# Patient Record
Sex: Female | Born: 1965 | Race: Black or African American | Hispanic: No | Marital: Married | Smoking: Never smoker
Health system: Southern US, Community
[De-identification: ages and names within clinical notes are randomized; demographics above are authoritative.]

## PROBLEM LIST (undated history)

## (undated) DIAGNOSIS — D126 Benign neoplasm of colon, unspecified: Secondary | ICD-10-CM

## (undated) DIAGNOSIS — R200 Anesthesia of skin: Secondary | ICD-10-CM

## (undated) DIAGNOSIS — I10 Essential (primary) hypertension: Secondary | ICD-10-CM

## (undated) DIAGNOSIS — D649 Anemia, unspecified: Secondary | ICD-10-CM

## (undated) DIAGNOSIS — N951 Menopausal and female climacteric states: Secondary | ICD-10-CM

## (undated) DIAGNOSIS — T7840XA Allergy, unspecified, initial encounter: Secondary | ICD-10-CM

## (undated) DIAGNOSIS — K5790 Diverticulosis of intestine, part unspecified, without perforation or abscess without bleeding: Secondary | ICD-10-CM

## (undated) DIAGNOSIS — M79606 Pain in leg, unspecified: Secondary | ICD-10-CM

## (undated) DIAGNOSIS — K219 Gastro-esophageal reflux disease without esophagitis: Secondary | ICD-10-CM

## (undated) HISTORY — PX: WISDOM TOOTH EXTRACTION: SHX21

## (undated) HISTORY — DX: Gastro-esophageal reflux disease without esophagitis: K21.9

## (undated) HISTORY — DX: Pain in leg, unspecified: M79.606

## (undated) HISTORY — DX: Essential (primary) hypertension: I10

## (undated) HISTORY — DX: Diverticulosis of intestine, part unspecified, without perforation or abscess without bleeding: K57.90

## (undated) HISTORY — DX: Menopausal and female climacteric states: N95.1

## (undated) HISTORY — PX: DILATION AND CURETTAGE OF UTERUS: SHX78

## (undated) HISTORY — DX: Benign neoplasm of colon, unspecified: D12.6

## (undated) HISTORY — DX: Allergy, unspecified, initial encounter: T78.40XA

## (undated) HISTORY — PX: BREAST EXCISIONAL BIOPSY: SUR124

## (undated) HISTORY — DX: Anesthesia of skin: R20.0

## (undated) HISTORY — DX: Anemia, unspecified: D64.9

## (undated) HISTORY — PX: TUBAL LIGATION: SHX77

---
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
## 2007-01-02 ENCOUNTER — Emergency Department (HOSPITAL_COMMUNITY): Admission: EM | Admit: 2007-01-02 | Discharge: 2007-01-02 | Payer: Self-pay | Admitting: Emergency Medicine

## 2009-11-17 ENCOUNTER — Emergency Department (HOSPITAL_COMMUNITY): Admission: EM | Admit: 2009-11-17 | Discharge: 2009-11-17 | Payer: Self-pay | Admitting: Emergency Medicine

## 2010-02-26 ENCOUNTER — Ambulatory Visit (HOSPITAL_COMMUNITY): Admission: RE | Admit: 2010-02-26 | Discharge: 2010-02-26 | Payer: Self-pay | Admitting: Obstetrics and Gynecology

## 2011-01-12 LAB — CBC
Hemoglobin: 13 g/dL (ref 12.0–15.0)
RBC: 3.69 MIL/uL — ABNORMAL LOW (ref 3.87–5.11)
RDW: 13.3 % (ref 11.5–15.5)
WBC: 6.1 10*3/uL (ref 4.0–10.5)

## 2011-01-12 LAB — COMPREHENSIVE METABOLIC PANEL
ALT: 28 U/L (ref 0–35)
Alkaline Phosphatase: 38 U/L — ABNORMAL LOW (ref 39–117)
CO2: 28 mEq/L (ref 19–32)
Chloride: 103 mEq/L (ref 96–112)
Glucose, Bld: 79 mg/dL (ref 70–99)
Potassium: 3.7 mEq/L (ref 3.5–5.1)
Sodium: 137 mEq/L (ref 135–145)
Total Bilirubin: 0.5 mg/dL (ref 0.3–1.2)
Total Protein: 8.1 g/dL (ref 6.0–8.3)

## 2011-01-12 LAB — HCG, SERUM, QUALITATIVE: Preg, Serum: NEGATIVE

## 2011-02-16 ENCOUNTER — Other Ambulatory Visit: Payer: Self-pay | Admitting: Obstetrics and Gynecology

## 2011-02-16 DIAGNOSIS — R928 Other abnormal and inconclusive findings on diagnostic imaging of breast: Secondary | ICD-10-CM

## 2011-02-17 ENCOUNTER — Ambulatory Visit
Admission: RE | Admit: 2011-02-17 | Discharge: 2011-02-17 | Disposition: A | Discharge status: Home / Self Care | Payer: 59 | Source: Ambulatory Visit | Attending: Obstetrics and Gynecology | Admitting: Obstetrics and Gynecology

## 2011-02-17 DIAGNOSIS — R928 Other abnormal and inconclusive findings on diagnostic imaging of breast: Secondary | ICD-10-CM

## 2013-11-05 ENCOUNTER — Other Ambulatory Visit: Payer: Self-pay | Admitting: Obstetrics and Gynecology

## 2013-11-05 DIAGNOSIS — R928 Other abnormal and inconclusive findings on diagnostic imaging of breast: Secondary | ICD-10-CM

## 2013-11-19 ENCOUNTER — Ambulatory Visit
Admission: RE | Admit: 2013-11-19 | Discharge: 2013-11-19 | Disposition: A | Discharge status: Home / Self Care | Payer: 59 | Source: Ambulatory Visit | Attending: Obstetrics and Gynecology | Admitting: Obstetrics and Gynecology

## 2013-11-19 DIAGNOSIS — R928 Other abnormal and inconclusive findings on diagnostic imaging of breast: Secondary | ICD-10-CM

## 2014-06-18 ENCOUNTER — Encounter: Payer: Self-pay | Admitting: *Deleted

## 2014-06-19 ENCOUNTER — Ambulatory Visit (INDEPENDENT_AMBULATORY_CARE_PROVIDER_SITE_OTHER): Payer: 59 | Admitting: Neurology

## 2014-06-19 ENCOUNTER — Encounter: Payer: Self-pay | Admitting: Neurology

## 2014-06-19 ENCOUNTER — Telehealth: Payer: Self-pay | Admitting: *Deleted

## 2014-06-19 VITALS — BP 120/85 | HR 91 | Ht 64.0 in | Wt 159.8 lb

## 2014-06-19 DIAGNOSIS — H539 Unspecified visual disturbance: Secondary | ICD-10-CM | POA: Insufficient documentation

## 2014-06-19 DIAGNOSIS — R209 Unspecified disturbances of skin sensation: Secondary | ICD-10-CM

## 2014-06-19 DIAGNOSIS — M5010 Cervical disc disorder with radiculopathy, unspecified cervical region: Secondary | ICD-10-CM

## 2014-06-19 DIAGNOSIS — M5412 Radiculopathy, cervical region: Secondary | ICD-10-CM

## 2014-06-19 DIAGNOSIS — M79662 Pain in left lower leg: Secondary | ICD-10-CM | POA: Insufficient documentation

## 2014-06-19 DIAGNOSIS — G609 Hereditary and idiopathic neuropathy, unspecified: Secondary | ICD-10-CM

## 2014-06-19 DIAGNOSIS — R29818 Other symptoms and signs involving the nervous system: Secondary | ICD-10-CM

## 2014-06-19 DIAGNOSIS — M79609 Pain in unspecified limb: Secondary | ICD-10-CM

## 2014-06-19 DIAGNOSIS — R202 Paresthesia of skin: Secondary | ICD-10-CM

## 2014-06-19 NOTE — Progress Notes (Addendum)
[facility]    Provider:  Dr Jaynee Eagles Referring Provider: Pedro Earls, MD Primary Care Physician:  No primary provider on file.  CC:  paresthesias  HPI:  Anita Brandt is a 48 y.o. female here as a referral from Dr. Delilah Shan for paresthesias  48 year old female PMHx HTN who is here for evaluation of left-sided paresthesias. She gets cramps in the left calf, it is hard to bend leg because of painful tightness. She is also getting shooting pains from her hip all th way down to calf. She also has radiating pain radiating in her upper arm, tingly and numb. Her vision changed drastically in march all of a sudden, had to get "readers" but with correction the vision not entirely back to baseline. Recently saw the optometrist and vision is still blurry. The paresthesias started in may of this year. The symptoms are getting worse. Her leg hurts. She is concerned she may have a blood clot. She can't walk sometimes it can get 5/10 and she is limping. These symptoms are different than her knee pain. No low back pain or neck pain. Sensations happen daily throughout the day. Can last a varying amount of time. Symptoms worse with increased activity but not better with rest; even when sleeping the symptoms persist. No weakness. No paresthesias in the toes or fingers.  Never had imaging of brain or neck.  No paresthesias in the fingers or toes. No FHx of neuromuscular or neurodegenerative disorders except grandmother with alzheimers.   Reviewed notes, labs and imaging from outside physicians, which showed patient has been followed by orthopedics for knee pain and has been evaluated regarding the paresthesias which generated today's referral to neurology. She was given a prednisone taper to see if that would help knee pain and in turn improve the sensory and muscle symptoms. Clinical examination of cervical, thoracic and lumbar spine was unremarkable.   Review of Systems: Patient complains of  symptoms per HPI as well as the following symptoms blurred vision, feeling hot, snoring, numbness, weakness, restless legs. Pertinent negatives per HPI. Otherwise out of a complete 14 system review, and all other reviewed systems are negative.   History   Social History  . Marital Status: Married    Spouse Name: N/A    Number of Children: 1  . Years of Education: BS   Occupational History  .      full-time   Social History Main Topics  . Smoking status: Never Smoker   . Smokeless tobacco: Never Used  . Alcohol Use: Yes     Comment: less than 5 times per week ( beer, wine and hard liquor)  . Drug Use: No  . Sexual Activity: Not on file   Other Topics Concern  . Not on file   Social History Narrative   Patient is married with one child.   Patient is right handed.   Patient has a BS degree.   Patient drinks 1 8 oz drink daily.    Family History  Problem Relation Age of Onset  . Cancer Father   . Cancer Maternal Grandmother   . Hypertension Mother   . Hypertension Father     Past Medical History  Diagnosis Date  . High blood pressure   . Numbness   . Leg pain   . Perimenopause     Past Surgical History  Procedure Laterality Date  . Dilation and curettage of uterus      Uterine abalation  . Tubal  ligation      Current Outpatient Prescriptions  Medication Sig Dispense Refill  . estradiol-norethindrone (COMBIPATCH) 0.05-0.25 MG/DAY Place 1 patch onto the skin 2 (two) times a week.      Marland Kitchen ibuprofen (ADVIL,MOTRIN) 800 MG tablet Take 800 mg by mouth 3 (three) times daily.      . Olmesartan Medoxomil-HCTZ (BENICAR HCT PO) Take by mouth.       No current facility-administered medications for this visit.    Allergies as of 06/19/2014 - Review Complete 06/19/2014  Allergen Reaction Noted  . Penicillin g potassium in d5w  06/18/2014  . Sulfa antibiotics  06/18/2014    Vitals: BP 120/85  Pulse 91  Ht 5\' 4"  (1.626 m)  Wt 159 lb 12.8 oz (72.485 kg)  BMI 27.42  kg/m2 Last Weight:  Wt Readings from Last 1 Encounters:  06/19/14 159 lb 12.8 oz (72.485 kg)   Last Height:   Ht Readings from Last 1 Encounters:  06/19/14 5\' 4"  (1.626 m)     Physical exam: Exam: Gen: NAD, conversant Eyes: anicteric sclerae, moist conjunctivae HENT: Atraumatic, oropharynx clear Neck: Trachea midline; supple,  Lungs: CTA, no wheezing, rales, rhonic                          CV: RRR, no MRG Abdomen: Soft, non-tender;  Extremities: No peripheral edema  Skin: Normal temperature, no rash,  Psych: Appropriate affect, pleasant  Neuro: Detailed Neurologic Exam  Speech:    Speech is normal; fluent and spontaneous with normal comprehension.  Cognition:    The patient is oriented to person, place, and time; memory intact; language fluent; normal attention, concentration, and fund of knowledge.  Cranial Nerves:    The pupils are equal, round, and reactive to light. The fundi are normal and spontaneous venous pulsations are present. Visual fields are full to finger confrontation. Extraocular movements are intact. Trigeminal sensation is intact and the muscles of mastication are normal. The face is symmetric. The palate elevates in the midline. Voice is normal. Shoulder shrug is normal. The tongue has normal motion without fasciculations.   Coordination:    Normal finger to nose and heel to shin. Normal rapid alternating movements.   Gait:    Heel-toe and tandem gait are normal.   Motor Observation:    No asymmetry, no atrophy, and no involuntary movements noted. Tone:    Normal muscle tone.   Posture:    Posture is normal.     Strength:    Strength is V/V in the upper and lower limbs.         Vibratory Sensation:    Normal vibratory sensation in upper and lower extremities.    Light Touch:    Normal light touch sensation in upper and lower extremities.    Proprioception:    Mildly impaired distally in the lower extremities   Pin Prick:    Normal  sensation to pinprick in upper and lower extremities.   Temperature: impaired distally in the lower extremities   Reflex Exam:  DTR's:    Deep tendon reflexes in the upper and lower extremities are normal bilaterally.   Toes:    The toes are downgoing bilaterally.  Clonus:    Clonus is absent.   Assessment:  48 year old female with a PMHx of high blood pressure who is here for evaluation of new onset, worsening paresthesias in the left arm and left leg with recent significant acute vision changes. Neuro  exam is significant for minimally decreased temperature and proprioception distally in the lower extremities.    Given paresthesias and vision changes will order MRI of the brain and cervical spine to rule out demyelinating disease such as MS or NMO and rule out other intracranial pathology.  Will further investigate paresthesias with an EMG/NCS of the left arm and leg focusing on peripheral polyneuropathy and lower leg sciatic/lumbosacral neuropathy. Also order serum neuropathy workup. Will send patient stat to rule out DVT in the left calf given persistent new pain and patient's report of calf swelling.  Cervical mri due to paresthesias in the upper extremity.   Sarina Ill, MD  [facility] [address] [place],  [address]  Phone [phone] Fax [phone] Lenor Coffin

## 2014-06-19 NOTE — Telephone Encounter (Signed)
Spoke with patient informed her that ultrasound was normal, no concerns for DVT, patient verbalized understanding.

## 2014-06-19 NOTE — Patient Instructions (Signed)
Overall you are doing fairly well but I do want to suggest a few things today:   Remember to drink plenty of fluid, eat healthy meals and do not skip any meals. Try to eat protein with a every meal and eat a healthy snack such as fruit or nuts in between meals. Try to keep a regular sleep-wake schedule and try to exercise daily, particularly in the form of walking, 20-30 minutes a day, if you can.   As far as diagnostic testing: emg/ncs, MRI of the brain and neck, blood work, doppler of the left leg  I would like to see you back in within 2 weeks for emg/ncs, sooner if we need to. Please call us with any interim questions, concerns, problems, updates or refill requests.   Please also call us for any test results so we can go over those with you on the phone.  My clinical assistant and will answer any of your questions and relay your messages to me and also relay most of my messages to you.   Our phone number is [phone]. We also have an after hours call service for urgent matters and there is a physician on-call for urgent questions. For any emergencies you know to call 911 or go to the nearest emergency room

## 2014-06-20 ENCOUNTER — Telehealth: Payer: Self-pay | Admitting: Neurology

## 2014-06-20 NOTE — Telephone Encounter (Signed)
Called patient to make sure she scheduled her emg/ncs. Also reviewed the [place] of her leg. No DVTs (office called her already with neg results).

## 2014-06-24 LAB — COMPREHENSIVE METABOLIC PANEL
ALT: 23 IU/L (ref 0–32)
AST: 20 IU/L (ref 0–40)
Albumin/Globulin Ratio: 1.6 (ref 1.1–2.5)
Albumin: 4.9 g/dL (ref 3.5–5.5)
Alkaline Phosphatase: 62 IU/L (ref 39–117)
BUN/Creatinine Ratio: 22 (ref 9–23)
BUN: 15 mg/dL (ref 6–24)
CALCIUM: 10.2 mg/dL (ref 8.7–10.2)
CHLORIDE: 97 mmol/L (ref 97–108)
CO2: 28 mmol/L (ref 18–29)
Creatinine, Ser: 0.67 mg/dL (ref 0.57–1.00)
GFR calc Af Amer: 121 mL/min/{1.73_m2} (ref >59)
GFR, EST NON AFRICAN AMERICAN: 105 mL/min/{1.73_m2} (ref >59)
GLUCOSE: 91 mg/dL (ref 65–99)
Globulin, Total: 3.1 g/dL (ref 1.5–4.5)
POTASSIUM: 4 mmol/L (ref 3.5–5.2)
SODIUM: 140 mmol/L (ref 134–144)
TOTAL PROTEIN: 8 g/dL (ref 6.0–8.5)
Total Bilirubin: 0.6 mg/dL (ref 0.0–1.2)

## 2014-06-24 LAB — IFE AND PE, SERUM
ALPHA 1: 0.2 g/dL (ref 0.1–0.4)
ALPHA2 GLOB SERPL ELPH-MCNC: 0.6 g/dL (ref 0.4–1.2)
Albumin SerPl Elph-Mcnc: 4.3 g/dL (ref 3.2–5.6)
Albumin/Glob SerPl: 1.2 (ref 0.7–2.0)
B-GLOBULIN SERPL ELPH-MCNC: 1.2 g/dL (ref 0.6–1.3)
GLOBULIN, TOTAL: 3.7 g/dL (ref 2.0–4.5)
Gamma Glob SerPl Elph-Mcnc: 1.6 g/dL (ref 0.5–1.6)
IGA/IMMUNOGLOBULIN A, SERUM: 228 mg/dL (ref 91–414)
IGM (IMMUNOGLOBULIN M), SRM: 43 mg/dL (ref 40–230)
IgG (Immunoglobin G), Serum: 1690 mg/dL — ABNORMAL HIGH (ref 700–1600)

## 2014-06-24 LAB — HIV ANTIBODY (ROUTINE TESTING W REFLEX)
HIV 1/O/2 Abs-Index Value: <1 (ref <1.00)
HIV-1/HIV-2 Ab: NONREACTIVE

## 2014-06-24 LAB — CRYOGLOBULIN, QL, SERUM, RFLX

## 2014-06-24 LAB — ANA W/REFLEX: Anti Nuclear Antibody(ANA): NEGATIVE

## 2014-06-24 LAB — HEMOGLOBIN A1C
ESTIMATED AVERAGE GLUCOSE: 105 mg/dL
HEMOGLOBIN A1C: 5.3 % (ref 4.8–5.6)

## 2014-06-24 LAB — VITAMIN B12: VITAMIN B 12: 1808 pg/mL — AB (ref 211–946)

## 2014-06-24 LAB — ANGIOTENSIN CONVERTING ENZYME: ANGIO CONVERT ENZYME: 43 U/L (ref 14–82)

## 2014-06-24 LAB — ANTIMYELOPEROXIDASE (MPO) ABS

## 2014-06-24 LAB — LYME, TOTAL AB TEST/REFLEX

## 2014-06-24 LAB — RHEUMATOID FACTOR: RHEUMATOID FACTOR: 7.7 [IU]/mL (ref 0.0–13.9)

## 2014-06-24 LAB — SEDIMENTATION RATE: SED RATE: 10 mm/h (ref 0–32)

## 2014-06-24 LAB — TSH: TSH: 0.658 u[IU]/mL (ref 0.450–4.500)

## 2014-06-25 ENCOUNTER — Ambulatory Visit (INDEPENDENT_AMBULATORY_CARE_PROVIDER_SITE_OTHER): Payer: 59 | Admitting: Neurology

## 2014-06-25 ENCOUNTER — Encounter (INDEPENDENT_AMBULATORY_CARE_PROVIDER_SITE_OTHER): Payer: Self-pay | Admitting: Radiology

## 2014-06-25 DIAGNOSIS — R209 Unspecified disturbances of skin sensation: Secondary | ICD-10-CM

## 2014-06-25 DIAGNOSIS — G609 Hereditary and idiopathic neuropathy, unspecified: Secondary | ICD-10-CM

## 2014-06-25 DIAGNOSIS — M5416 Radiculopathy, lumbar region: Secondary | ICD-10-CM

## 2014-06-25 DIAGNOSIS — IMO0002 Reserved for concepts with insufficient information to code with codable children: Secondary | ICD-10-CM

## 2014-06-25 DIAGNOSIS — Z0289 Encounter for other administrative examinations: Secondary | ICD-10-CM

## 2014-06-25 DIAGNOSIS — R202 Paresthesia of skin: Secondary | ICD-10-CM

## 2014-06-25 NOTE — Progress Notes (Addendum)
   [facility]    Provider:  Dr Jaynee Eagles Referring Provider: No ref. provider found Primary Care Physician:  No primary provider on file.  CC:  paresthesias  HPI:  Anita Brandt is a 48 y.o. female here as a referral from Dr. No ref. provider found for paresthesias   History: 48 year old female PMHx HTN who is here for evaluation of left-sided paresthesias. She gets cramps in the left calf, it is hard to bend leg because of painful tightness. She is also getting shooting pains from her hip all the way down to calf. She also has radiating pain into her upper arm, tingly and numb.  The paresthesias started in may of this year. The symptoms are getting worse. Her leg hurts. She can't walk sometimes it can get 5/10 and she is limping. These symptoms are different than her knee pain. No low back pain or neck pain. Sensations happen throughout the day every day. Can last a varying amount of time. Symptoms worse with increased activity but not better with rest; even when sleeping the symptoms persist. No weakness. No paresthesias in the toes or fingers.Neuro exam is significant for minimally decreased temperature and proprioception distally in the lower extremities.    Summary: Nerve Conduction Studies were performed on the left upper and left lower extremities. Evaluation of the the left Median, left Ulnar, left Peroneal and left Tibial motor nerves showed normal conductions and normal F wave latencies.  Evaluation of the left Median, left Ulnar, left Sural sensory nerves revealed normal conductions. The Median vs. Ulnar Palmar Mixed Nerve Study showed no significant difference in latency.  Needle EMG study was performed on the left upper and left lower extremities. Evaluation of the left lumbar L5 paraspinal muscles showed increased spontaneous activity. The remaining left upper and left lower muscles were normal: Deltoid, Triceps, Pronator teres, First Dorsal Interosseous, Opponens  Pollicis, Anterior Tibialis, Extensor Hallucis Longus, Vastus Medialis, Medial Gastrocnemius, Biceps Femoris (long head), Gluteus Maximus and S1 paraspinal muscles.   Assessment/Plan:  EMG shows acute/ongoing denervation isolated to the L5 paraspinals, consistent with left radiculopathy. Given the lack of similar changes in limb muscles and considerable overlap in paraspinal innervation, a more precise localization cannot be identified at this time. No evidence for cervical radiculopathy, polyneuropathy or median/ulnar compression neuropathy. Clinical correlation recommended.  Sarina Ill, MD  [facility] [address] [place], [place] [address]  Phone [phone] Fax [phone] Lenor Coffin

## 2014-06-27 ENCOUNTER — Ambulatory Visit (INDEPENDENT_AMBULATORY_CARE_PROVIDER_SITE_OTHER): Payer: 59

## 2014-06-27 DIAGNOSIS — IMO0002 Reserved for concepts with insufficient information to code with codable children: Secondary | ICD-10-CM

## 2014-06-27 DIAGNOSIS — M5416 Radiculopathy, lumbar region: Secondary | ICD-10-CM

## 2014-06-27 DIAGNOSIS — R202 Paresthesia of skin: Secondary | ICD-10-CM

## 2014-06-27 DIAGNOSIS — R209 Unspecified disturbances of skin sensation: Secondary | ICD-10-CM

## 2014-06-27 DIAGNOSIS — R29818 Other symptoms and signs involving the nervous system: Secondary | ICD-10-CM

## 2014-06-27 DIAGNOSIS — G609 Hereditary and idiopathic neuropathy, unspecified: Secondary | ICD-10-CM

## 2014-06-27 MED ORDER — GADOPENTETATE DIMEGLUMINE 469.01 MG/ML IV SOLN
15.0000 mL | Freq: Once | INTRAVENOUS | Status: AC | PRN
Start: 2014-06-27 — End: 2014-06-27

## 2014-07-03 ENCOUNTER — Other Ambulatory Visit: Payer: Self-pay | Admitting: Neurology

## 2014-07-03 DIAGNOSIS — M5010 Cervical disc disorder with radiculopathy, unspecified cervical region: Secondary | ICD-10-CM

## 2014-07-03 DIAGNOSIS — M5416 Radiculopathy, lumbar region: Secondary | ICD-10-CM

## 2014-07-08 NOTE — Progress Notes (Signed)
Patient returned my call, stated that she has not yet heard from physical therapy, instructed her that she has not received a call by the end of this week to call back, patient verbalized understanding.

## 2014-07-08 NOTE — Progress Notes (Signed)
Referrals were completed on 07/05/14, called patient to see if anyone has contacted her for scheduling, left voice message for patient to return my call.

## 2014-07-16 ENCOUNTER — Other Ambulatory Visit: Payer: Self-pay | Admitting: Neurology

## 2014-07-16 ENCOUNTER — Telehealth: Payer: Self-pay | Admitting: *Deleted

## 2014-07-16 DIAGNOSIS — M5416 Radiculopathy, lumbar region: Secondary | ICD-10-CM

## 2014-07-16 DIAGNOSIS — M5412 Radiculopathy, cervical region: Secondary | ICD-10-CM

## 2014-07-16 MED ORDER — GABAPENTIN 300 MG PO CAPS: 900.0000 mg | ORAL_CAPSULE | Freq: Every day | ORAL | Status: DC

## 2014-07-16 NOTE — Telephone Encounter (Signed)
Pt calling and is asking about referral for epidural steroid injections.  [facility] do not do injections.   I called and spoke to [facility] with [place].  They did not receive order.  She printed off as we spoke.  She will call pt.   FYI (she relayed that the pt can only have a total of 3 injections in a 6 month time frame).  PT contacted pt and she will see them 07-29-14.  Pt is asking about oral medication to help with pain / sleep to get her by until injections.  Dr. Delilah Shan trid 800mg  ibuprofen which did help.  She asked about Lorrin Mais (to help her sleep) mask pain?  Her # [phone].  [facility] in [place].   Allergies:  PCN, sulfa.

## 2014-07-16 NOTE — Telephone Encounter (Signed)
Thank you Lovey Newcomer!! Can you please insure that patient gets an appointment. I will prescribe her neurontin at night. I spoke to her about all of this. Thank you!!

## 2014-07-17 NOTE — Telephone Encounter (Signed)
Pt scheduled for 07-30-14 at 1430 at [facility] for epidural steroid injections.

## 2014-07-26 ENCOUNTER — Ambulatory Visit: Payer: 59

## 2014-07-29 ENCOUNTER — Ambulatory Visit: Payer: 59 | Attending: Neurology

## 2014-07-29 DIAGNOSIS — M5412 Radiculopathy, cervical region: Secondary | ICD-10-CM | POA: Diagnosis not present

## 2014-07-29 DIAGNOSIS — R52 Pain, unspecified: Secondary | ICD-10-CM | POA: Diagnosis not present

## 2014-07-29 DIAGNOSIS — Z5189 Encounter for other specified aftercare: Secondary | ICD-10-CM | POA: Diagnosis present

## 2014-07-30 ENCOUNTER — Ambulatory Visit
Admission: RE | Admit: 2014-07-30 | Discharge: 2014-07-30 | Disposition: A | Discharge status: Home / Self Care | Payer: 59 | Source: Ambulatory Visit | Attending: Neurology | Admitting: Neurology

## 2014-07-30 VITALS — BP 113/78 | HR 62

## 2014-07-30 DIAGNOSIS — M5412 Radiculopathy, cervical region: Secondary | ICD-10-CM

## 2014-07-30 DIAGNOSIS — M5416 Radiculopathy, lumbar region: Secondary | ICD-10-CM

## 2014-07-30 MED ORDER — METHYLPREDNISOLONE ACETATE 40 MG/ML INJ SUSP (RADIOLOG
120.0000 mg | Freq: Once | INTRAMUSCULAR | Status: AC
  Administered 2014-07-30: 120 mg via EPIDURAL

## 2014-07-30 MED ORDER — IOHEXOL 180 MG/ML  SOLN
1.0000 mL | Freq: Once | INTRAMUSCULAR | Status: AC | PRN
  Administered 2014-07-30: 1 mL via EPIDURAL

## 2014-07-30 NOTE — Discharge Instructions (Signed)

## 2014-07-31 ENCOUNTER — Other Ambulatory Visit: Payer: Self-pay | Admitting: Neurology

## 2014-07-31 DIAGNOSIS — M542 Cervicalgia: Secondary | ICD-10-CM

## 2014-08-05 ENCOUNTER — Ambulatory Visit: Payer: 59 | Admitting: Physical Therapy

## 2014-08-05 DIAGNOSIS — Z5189 Encounter for other specified aftercare: Secondary | ICD-10-CM | POA: Diagnosis not present

## 2014-08-08 ENCOUNTER — Ambulatory Visit: Payer: 59 | Admitting: Physical Therapy

## 2014-08-08 DIAGNOSIS — Z5189 Encounter for other specified aftercare: Secondary | ICD-10-CM | POA: Diagnosis not present

## 2014-08-12 ENCOUNTER — Ambulatory Visit: Payer: 59 | Admitting: Physical Therapy

## 2014-08-12 DIAGNOSIS — Z5189 Encounter for other specified aftercare: Secondary | ICD-10-CM | POA: Diagnosis not present

## 2014-08-21 ENCOUNTER — Ambulatory Visit
Admission: RE | Admit: 2014-08-21 | Discharge: 2014-08-21 | Disposition: A | Discharge status: Home / Self Care | Payer: 59 | Source: Ambulatory Visit | Attending: Neurology | Admitting: Neurology

## 2014-08-21 VITALS — BP 118/74 | HR 81

## 2014-08-21 DIAGNOSIS — M542 Cervicalgia: Secondary | ICD-10-CM

## 2014-08-21 MED ORDER — METHYLPREDNISOLONE ACETATE 40 MG/ML INJ SUSP (RADIOLOG
120.0000 mg | Freq: Once | INTRAMUSCULAR | Status: AC
  Administered 2014-08-21: 120 mg via EPIDURAL

## 2014-08-21 MED ORDER — IOHEXOL 180 MG/ML  SOLN
1.0000 mL | Freq: Once | INTRAMUSCULAR | Status: AC | PRN
  Administered 2014-08-21: 1 mL via EPIDURAL

## 2014-08-22 ENCOUNTER — Ambulatory Visit: Payer: 59

## 2014-09-04 ENCOUNTER — Ambulatory Visit: Payer: 59 | Attending: Neurology

## 2014-09-04 DIAGNOSIS — Z5189 Encounter for other specified aftercare: Secondary | ICD-10-CM | POA: Diagnosis present

## 2014-09-04 DIAGNOSIS — M5010 Cervical disc disorder with radiculopathy, unspecified cervical region: Secondary | ICD-10-CM

## 2014-09-04 DIAGNOSIS — R52 Pain, unspecified: Secondary | ICD-10-CM | POA: Insufficient documentation

## 2014-09-04 DIAGNOSIS — M5412 Radiculopathy, cervical region: Secondary | ICD-10-CM | POA: Insufficient documentation

## 2014-09-04 DIAGNOSIS — M25562 Pain in left knee: Secondary | ICD-10-CM

## 2014-09-04 NOTE — Patient Instructions (Signed)
Pt reported she is fine and is not doing HEP. She demonstrated poor body mechanics with picking items out of purse. We reviewed good posture(she is aware as she uses a towel roll in chair at work) and good Economist with explanation about limiting bending and twisting and how this decreases load to spine. We discussed continuing HEP but she expressed that she is not doing them.

## 2014-09-04 NOTE — Therapy (Signed)
Physical Therapy Treatment  Patient Details  Name: Anita Brandt MRN: [id number] Date of Birth: 02-Feb-1966  Encounter Date: 09/04/2014      PT End of Session - 09/04/14 0923    Visit Number 5   Number of Visits 12   PT Start Time 0850   PT Stop Time 0920   PT Time Calculation (min) 30 min   Activity Tolerance Patient tolerated treatment well   Behavior During Therapy [facility] for tasks assessed/performed      Past Medical History  Diagnosis Date  . High blood pressure   . Numbness   . Leg pain   . Perimenopause     Past Surgical History  Procedure Laterality Date  . Dilation and curettage of uterus      Uterine abalation  . Tubal ligation      There were no vitals taken for this visit.  Visit Diagnosis:  Cervical disc disorder with radiculopathy of cervical region  Knee pain, acute, left      Subjective Assessment - 09/04/14 0854    Symptoms She recieved an injection into lower back 08/21/14.    Today she has a little weakness in LT knee.  She feels the injection may wear off.  She is independent now without problems.   How long can you sit comfortably? as needed   How long can you stand comfortably? Stand as needed   How long can you walk comfortably? walk as needed   Currently in Pain? No/denies              PT Education - 09/04/14 0922    Education provided Yes   Education Details posture and body mechanics   Person(s) Educated Patient   Methods Explanation;Demonstration   Comprehension Verbalized understanding;Returned demonstration          PT Short Term Goals - 09/04/14 0924    PT SHORT TERM GOAL #1   Title Independent with inital HEP    Status Achieved   PT SHORT TERM GOAL #2   Title Pain decreased 25% or mroe in knee   Status Achieved          PT Long Term Goals - 09/04/14 0925    PT LONG TERM GOAL #1   Title Demonstrate understanding of use of cold to decr edema /pain   Status Deferred   PT LONG TERM GOAL #2   Title  Independent with advanced HEP   Status Not Met  she is not doing her HEP   PT LONG TERM GOAL #3   Title Report 75% decreased pain in LT knee with walking   Status Achieved   PT LONG TERM GOAL #4   Title Increase active LT knee flexion to 125 degrees to allow smooth walking   Status Achieved   PT LONG TERM GOAL #5   Title Incr LLT knee extensiont to 180 degrees to allow ease with walking and ease pain with stretching   Status Achieved          Plan - 09/04/14 0923    Clinical Impression Statement Ms Ezekiel is doing well post injection and has no limitations.  she agreed to discharge.   Consulted and Agree with Plan of Care Patient        Problem List Patient Active Problem List   Diagnosis Date Noted  . Paresthesias 06/19/2014  . Pain of left calf 06/19/2014  . Vision changes 06/19/2014     PHYSICAL THERAPY DISCHARGE SUMMARY  Visits from [facility]  of Care: 5  Current functional level related to goals / functional outcomes: See ABOVE   Remaining deficits: None   Education / Equipment: HEP and Water engineer education Plan: Patient agrees to discharge.  Patient goals were not met. Patient is being discharged due to being pleased with the current functional level.  She is independent without pain. ?????                                              Darrel Hoover 09/04/2014, 9:30 AM

## 2014-09-20 ENCOUNTER — Emergency Department (HOSPITAL_COMMUNITY)
Admission: EM | Admit: 2014-09-20 | Discharge: 2014-09-20 | Disposition: A | Discharge status: Home / Self Care | Payer: 59 | Source: Home / Self Care | Attending: Family Medicine | Admitting: Family Medicine

## 2014-09-20 ENCOUNTER — Emergency Department (INDEPENDENT_AMBULATORY_CARE_PROVIDER_SITE_OTHER): Payer: 59

## 2014-09-20 ENCOUNTER — Encounter (HOSPITAL_COMMUNITY): Payer: Self-pay | Admitting: *Deleted

## 2014-09-20 DIAGNOSIS — S60221A Contusion of right hand, initial encounter: Secondary | ICD-10-CM

## 2014-09-20 NOTE — ED Notes (Signed)
Med  r  Thumb  Spica  splint

## 2014-09-20 NOTE — ED Notes (Signed)
Pt  Fell  Last  Pm  On  The  Floor    And   Injured  Her  r  Hand    -  She  Has  Bruising  /  Swelling  Noted       She  Reports  This  Happened  Yesterday          She   Did  Not  Black out  She  Is  Sitting  Upright on  Exam table  Speaking    In  Complete      sentances

## 2014-09-20 NOTE — Discharge Instructions (Signed)
Ice and splint as needed, then warm soak twice daily as needed, see orthopedist in 2 weeks if further problems.

## 2014-09-20 NOTE — ED Provider Notes (Signed)
CSN: [id number]     Arrival date & time 09/20/14  1419 History   First MD Initiated Contact with Patient 09/20/14 1509     Chief Complaint  Patient presents with  . Hand Injury   (Consider location/radiation/quality/duration/timing/severity/associated sxs/prior Treatment) Patient is a 48 y.o. female presenting with hand injury. The history is provided by the patient.  Hand Injury Location:  Hand Time since incident:  1 day Injury: yes   Mechanism of injury: fall   Fall:    Fall occurred: on hardwood floor in kitchen last eve.   Entrapped after fall: no   Hand location:  R hand Pain details:    Quality:  Aching   Radiates to:  Does not radiate   Severity:  Mild   Onset quality:  Gradual Chronicity:  New Handedness:  Right-handed Dislocation: no   Foreign body present:  No foreign bodies Prior injury to area:  No   Past Medical History  Diagnosis Date  . High blood pressure   . Numbness   . Leg pain   . Perimenopause    Past Surgical History  Procedure Laterality Date  . Dilation and curettage of uterus      Uterine abalation  . Tubal ligation     Family History  Problem Relation Age of Onset  . Cancer Father   . Cancer Maternal Grandmother   . Hypertension Mother   . Hypertension Father    History  Substance Use Topics  . Smoking status: Never Smoker   . Smokeless tobacco: Never Used  . Alcohol Use: 3.0 oz/week    6 drink(s) per week     Comment: less than 5 times per week ( beer, wine and hard liquor)   OB History    No data available     Review of Systems  Constitutional: Negative.   Musculoskeletal: Positive for myalgias. Negative for gait problem.  Skin: Negative for wound.    Allergies  Penicillin g potassium in d5w and Sulfa antibiotics  Home Medications   Prior to Admission medications   Medication Sig Start Date End Date Taking? Authorizing Provider  estradiol-norethindrone [facility]) 0.05-0.25 MG/DAY Place 1 patch onto the skin 2  (two) times a week.    Historical Provider, MD  gabapentin (NEURONTIN) 300 MG capsule Take 3 capsules (900 mg total) by mouth at bedtime. Start with one at night and can increase to 3 at night as tolerated and needed. Take one to two hours before bedtime. 07/16/14   Melvenia Beam, MD  ibuprofen (ADVIL,MOTRIN) 800 MG tablet Take 800 mg by mouth 3 (three) times daily.    Historical Provider, MD  Olmesartan Medoxomil-HCTZ (BENICAR HCT PO) Take by mouth.    Historical Provider, MD   BP 120/82 mmHg  Pulse 78  Temp(Src) 98.6 F (37 C) (Oral)  Resp 16  SpO2 100% Physical Exam  Constitutional: She is oriented to person, place, and time. She appears well-developed and well-nourished.  Musculoskeletal: She exhibits tenderness.       Hands: Neurological: She is alert and oriented to person, place, and time.  Nursing note and vitals reviewed.   ED Course  Procedures (including critical care time) Labs Review Labs Reviewed - No data to display  Imaging Review Dg Hand Complete Right  09/20/2014   CLINICAL DATA:  Fall with right hand pain and swelling along the anterior palm.  EXAM: RIGHT HAND - COMPLETE 3+ VIEW  COMPARISON:  None.  FINDINGS: Negative for a fracture or dislocation.  No significant soft tissue swelling. Alignment of the right hand is normal.  IMPRESSION: No acute abnormality.   Electronically Signed   By: Markus Daft M.D.   On: 09/20/2014 15:35   X-rays reviewed and report per radiologist.   MDM   1. Hand contusion, right, initial encounter        Billy Fischer, MD 09/20/14 1557

## 2014-10-16 ENCOUNTER — Other Ambulatory Visit: Payer: Self-pay | Admitting: Neurology

## 2014-10-16 DIAGNOSIS — M5416 Radiculopathy, lumbar region: Secondary | ICD-10-CM

## 2014-10-23 ENCOUNTER — Other Ambulatory Visit: Payer: 59

## 2014-10-31 ENCOUNTER — Other Ambulatory Visit: Payer: Self-pay | Admitting: Obstetrics and Gynecology

## 2014-11-01 LAB — CYTOLOGY - PAP

## 2015-09-05 ENCOUNTER — Ambulatory Visit: Payer: Self-pay | Admitting: Internal Medicine

## 2015-10-07 ENCOUNTER — Encounter: Payer: Self-pay | Admitting: *Deleted

## 2015-10-24 ENCOUNTER — Ambulatory Visit (INDEPENDENT_AMBULATORY_CARE_PROVIDER_SITE_OTHER): Payer: Commercial Managed Care - HMO | Admitting: Internal Medicine

## 2015-10-24 ENCOUNTER — Encounter: Payer: Self-pay | Admitting: Internal Medicine

## 2015-10-24 VITALS — BP 100/72 | HR 90 | Temp 97.6°F | Resp 18 | Ht 63.5 in | Wt 165.2 lb

## 2015-10-24 DIAGNOSIS — E663 Overweight: Secondary | ICD-10-CM

## 2015-10-24 DIAGNOSIS — J301 Allergic rhinitis due to pollen: Secondary | ICD-10-CM

## 2015-10-24 DIAGNOSIS — M4726 Other spondylosis with radiculopathy, lumbar region: Secondary | ICD-10-CM

## 2015-10-24 DIAGNOSIS — H6121 Impacted cerumen, right ear: Secondary | ICD-10-CM

## 2015-10-24 DIAGNOSIS — G4700 Insomnia, unspecified: Secondary | ICD-10-CM

## 2015-10-24 DIAGNOSIS — R635 Abnormal weight gain: Secondary | ICD-10-CM

## 2015-10-24 DIAGNOSIS — N959 Unspecified menopausal and perimenopausal disorder: Secondary | ICD-10-CM

## 2015-10-24 DIAGNOSIS — I10 Essential (primary) hypertension: Secondary | ICD-10-CM | POA: Diagnosis not present

## 2015-10-24 MED ORDER — PHENTERMINE-TOPIRAMATE ER 7.5-46 MG PO CP24: 1.0000 | ORAL_CAPSULE | Freq: Every day | ORAL | Status: DC

## 2015-10-24 MED ORDER — PHENTERMINE-TOPIRAMATE ER 3.75-23 MG PO CP24: 1.0000 | ORAL_CAPSULE | Freq: Every day | ORAL | Status: DC

## 2015-10-24 MED ORDER — FLUTICASONE PROPIONATE 50 MCG/ACT NA SUSP: NASAL | Status: DC

## 2015-10-24 NOTE — Progress Notes (Signed)
Patient ID: Anita Brandt, female   DOB: [birth year], 49 y.o.   MRN: [id number]    Location:    [facility]   Place of Service:   OFFICE   Advanced Directive information Does patient have an advance directive?: No, Would patient like information on creating an advanced directive?: Yes - Educational materials given  Chief Complaint  Patient presents with  . Establish Care    New Patient Establish Care  . Medical Management of Chronic Issues    HPI:  49 yo female seen today as a new pt. She is c/a ear pressure R>L with sinus HA. She has hx seasonal allergy and takes zyrtec daily. She also has pain in her chest intermittent x  1 month and occasionally radiates into right arm. No known injury. Pain lasts 1-2 minutes and resolves spontaneously. Pain worsens with twisting motions (eg, backing out driveway).   Paresthesias/scoliosis - stable on gabapentin. She rec'd rehab in the past and has had injections. Last MRI L spine in 06/2014 revealed marked scoliosis and spondylitic changes with foraminal narrowing at L3-L4, L4-L5 L>R. Followed by neurology  Insomnia - stable on zolpidem. Managed by Dr Gertie Fey  HTN - stable on benicar hct  Obesity - she reports she has gained 30 lbs in 4 yrs. Ran out of qsymia in March 2016.Marland Kitchen She tried belviq after completing qsymia but it was ineffective.  HRT - she stopped tx herself >1 yr ago. She has hot flashes and mood swings daily. GYN is Dr Gertie Fey at [facility].  She lives with spouse and 48 yo daughter  Previous PCP [facility]  Past Medical History  Diagnosis Date  . High blood pressure   . Numbness   . Leg pain   . Perimenopause     Past Surgical History  Procedure Laterality Date  . Dilation and curettage of uterus      Uterine abalation  . Tubal ligation      Patient Care Team: Gildardo Cranker, DO as PCP - General (Internal Medicine)  Social History   Social History  . Marital Status: Married    Spouse Name: N/A  .  Number of Children: 1  . Years of Education: BS   Occupational History  .      full-time   Social History Main Topics  . Smoking status: Never Smoker   . Smokeless tobacco: Never Used  . Alcohol Use: 3.0 oz/week    6 Standard drinks or equivalent per week     Comment: less than 5 times per week ( beer, wine and hard liquor)  . Drug Use: No  . Sexual Activity: Not on file   Other Topics Concern  . Not on file   Social History Narrative   Patient is married with one child.   Patient is right handed.   Patient has a BS degree.   Patient drinks 1 8 oz drink daily.            Diet: depends daily   Do you drink/eat things with caffeine? Yes   Marital status: Married                             What year were you married? 2012   Do you live in a house, apartment, assisted living, condo, trailer, etc)?  House   Is it one or more stories? 2 stories   How many persons live in your home? 3  Do you have any pets in your home?No pets   Current or past profession: Hotel manager   Do you exercise?     sometimes                                                Type & how often: Irregular   Do you have a living will? No   Do you have a DNR Form? No   Do you have a POA/HPOA forms? No     reports that she has never smoked. She has never used smokeless tobacco. She reports that she drinks about 3.0 oz of alcohol per week. She reports that she does not use illicit drugs.  Family History  Problem Relation Age of Onset  . Cancer Father     lung cancer  . Hypertension Father   . Cancer Maternal Grandmother   . Hypertension Mother   . Arthritis Mother   . COPD Paternal 15    Family Status  Relation Status Death Age  . Father Deceased 76    Lung cancer  . Mother Alive   . Brother Alive   . Son Alive      There is no immunization history on file for this patient.  Allergies  Allergen Reactions  . Penicillin G Potassium In D5w   . Sulfa Antibiotics     Medications: Patient's  Medications  New Prescriptions   No medications on file  Previous Medications   CETIRIZINE (ZYRTEC) 10 MG TABLET    Take 10 mg by mouth daily.   GABAPENTIN (NEURONTIN) 300 MG CAPSULE    Take 3 capsules (900 mg total) by mouth at bedtime. Start with one at night and can increase to 3 at night as tolerated and needed. Take one to two hours before bedtime.   IBUPROFEN (ADVIL,MOTRIN) 800 MG TABLET    Take 800 mg by mouth 3 (three) times daily.   OLMESARTAN MEDOXOMIL-HCTZ (BENICAR HCT PO)    Take by mouth.   PHENTERMINE-TOPIRAMATE (QSYMIA) 3.75-23 MG CP24    Take 1 capsule by mouth every day for 14 days in the morning   ZOLPIDEM (AMBIEN) 5 MG TABLET    Take 5 mg by mouth at bedtime as needed for sleep.  Modified Medications   No medications on file  Discontinued Medications   ESTRADIOL-NORETHINDRONE (COMBIPATCH) 0.05-0.25 MG/DAY    Place 1 patch onto the skin 2 (two) times a week.    Review of Systems  Constitutional: Positive for diaphoresis and unexpected weight change (gain).  Eyes: Positive for visual disturbance (wears glasses).  Allergic/Immunologic: Positive for environmental allergies.  All other systems reviewed and are negative.   Filed Vitals:   10/24/15 0832  BP: 100/72  Pulse: 90  Temp: 97.6 F (36.4 C)  TempSrc: Oral  Resp: 18  Height: 5' 3.5" (1.613 m)  Weight: 165 lb 3.2 oz (74.934 kg)  SpO2: 98%   Body mass index is 28.8 kg/(m^2).  Physical Exam  Constitutional: She is oriented to person, place, and time. She appears well-developed and well-nourished.  HENT:  Mouth/Throat: No oropharyngeal exudate.  Right cerumen impaction. Left TM appears nml. No redness or d/c. No sinus TTP. nares with enlarged grey turbinates. Oropharynx cobblestoning and red but no exudate  Eyes: Pupils are equal, round, and reactive to light. No scleral icterus.  Neck: Neck supple. Carotid  bruit is not present. No tracheal deviation present. No thyromegaly present.  Cardiovascular: Normal  rate, regular rhythm, normal heart sounds and intact distal pulses.  Exam reveals no gallop and no friction rub.   No murmur heard. No LE edema b/l. no calf TTP.   Pulmonary/Chest: Effort normal and breath sounds normal. No stridor. No respiratory distress. She has no wheezes. She has no rales.  Abdominal: Soft. Bowel sounds are normal. She exhibits no distension and no mass. There is no hepatomegaly. There is no tenderness. There is no rebound and no guarding.  Lymphadenopathy:    She has no cervical adenopathy.  Neurological: She is alert and oriented to person, place, and time.  Skin: Skin is warm and dry. No rash noted.  Psychiatric: She has a normal mood and affect. Her behavior is normal. Judgment and thought content normal.     Labs reviewed: No visits with results within 3 Month(s) from this visit. Latest known visit with results is:  Orders Only on 10/31/2014  Component Date Value Ref Range Status  . CYTOLOGY - PAP 10/31/2014 PAP RESULT   Final    No results found.   Assessment/Plan   ICD-9-CM ICD-10-CM   1. Cerumen impaction, right 380.4 H61.21   2. Allergic rhinitis due to pollen 477.0 J30.1 fluticasone (FLONASE) 50 MCG/ACT nasal spray  3. Weight gain 783.1 R63.5 CMP     CMP  4. Overweight (BMI 25.0-29.9) 278.02 E66.3   5. Benign essential HTN 401.1 I10 CMP     CMP  6. Insomnia 780.52 G47.00   7. Osteoarthritis of spine with radiculopathy, lumbar region 721.3 M47.26   8. Postmenopausal symptoms 627.9 N95.9     Get old records  Start qsymia 3.75/23mg  daily x 14 days then increase to 7.5/46mg  daily x 12 weeks for weight loss  Check CMP  Follow up in 1 mo for obesity   Kayhan Boardley S. Perlie Gold  [facility] [address] [place], [place] [address] [phone] Cell (Monday-Friday 8 AM - 5 PM) [phone] After 5 PM and follow prompts

## 2015-10-24 NOTE — Patient Instructions (Addendum)
Take qsymia 3.75 mg daily x 14 days then start 7.5 mg dose x 12 weeks  Will call with lab results  Use OTC Debrox ear drops for ear wax removal  Follow up in 1 month for weight reck  Follow up with specialists as scheduled

## 2015-10-25 LAB — COMPREHENSIVE METABOLIC PANEL
ALBUMIN: 4.7 g/dL (ref 3.5–5.5)
ALT: 41 IU/L — ABNORMAL HIGH (ref 0–32)
AST: 29 IU/L (ref 0–40)
Albumin/Globulin Ratio: 1.6 (ref 1.1–2.5)
Alkaline Phosphatase: 71 IU/L (ref 39–117)
BUN / CREAT RATIO: 28 — AB (ref 9–23)
BUN: 17 mg/dL (ref 6–24)
Bilirubin Total: 0.8 mg/dL (ref 0.0–1.2)
CALCIUM: 10.3 mg/dL — AB (ref 8.7–10.2)
CO2: 24 mmol/L (ref 18–29)
CREATININE: 0.6 mg/dL (ref 0.57–1.00)
Chloride: 97 mmol/L (ref 96–106)
GFR, EST AFRICAN AMERICAN: 125 mL/min/{1.73_m2} (ref >59)
GFR, EST NON AFRICAN AMERICAN: 108 mL/min/{1.73_m2} (ref >59)
GLUCOSE: 94 mg/dL (ref 65–99)
Globulin, Total: 3 g/dL (ref 1.5–4.5)
Potassium: 3.8 mmol/L (ref 3.5–5.2)
Sodium: 138 mmol/L (ref 134–144)
TOTAL PROTEIN: 7.7 g/dL (ref 6.0–8.5)

## 2015-11-12 ENCOUNTER — Telehealth: Payer: Self-pay

## 2015-11-12 NOTE — Telephone Encounter (Signed)
Prior Authorization was received for Qsymia 7.5-46mg  CER. Take one capsule by mouth daily, begin after completing 3.75-23 dose. I initiated prior authorization by calling [phone]. Patient ID # [id number]  Called [phone] and spoke with Amy B. Medication has been approved. Approval letter will be faxed to [facility] today.

## 2015-11-26 ENCOUNTER — Ambulatory Visit (INDEPENDENT_AMBULATORY_CARE_PROVIDER_SITE_OTHER): Payer: Managed Care, Other (non HMO) | Admitting: Internal Medicine

## 2015-11-26 ENCOUNTER — Encounter: Payer: Self-pay | Admitting: Internal Medicine

## 2015-11-26 VITALS — BP 110/62 | HR 95 | Temp 98.2°F | Resp 20 | Ht 64.0 in | Wt 155.4 lb

## 2015-11-26 DIAGNOSIS — R7401 Elevation of levels of liver transaminase levels: Secondary | ICD-10-CM

## 2015-11-26 DIAGNOSIS — R740 Nonspecific elevation of levels of transaminase and lactic acid dehydrogenase [LDH]: Secondary | ICD-10-CM | POA: Diagnosis not present

## 2015-11-26 DIAGNOSIS — M5442 Lumbago with sciatica, left side: Secondary | ICD-10-CM

## 2015-11-26 DIAGNOSIS — M419 Scoliosis, unspecified: Secondary | ICD-10-CM

## 2015-11-26 DIAGNOSIS — Z205 Contact with and (suspected) exposure to viral hepatitis: Secondary | ICD-10-CM | POA: Diagnosis not present

## 2015-11-26 MED ORDER — IBUPROFEN 800 MG PO TABS: 800.0000 mg | ORAL_TABLET | Freq: Three times a day (TID) | ORAL | Status: DC

## 2015-11-26 NOTE — Progress Notes (Signed)
Patient ID: Anita Brandt, female   DOB: [birth year], 50 y.o.   MRN: [id number]    Location:    [facility]   Place of Service:  OFFICE   Chief Complaint  Patient presents with  . Medical Management of Chronic Issues    wt check, hep c Test    HPI:  50 yo female seen today for f/u obesity. She was started on qsymia last month and has lost 10 lbs. Current BMI 26.67. She joined the Whole 30 diet plan in conjunction with med. She cut out soy, sodas, alcohol, sweets, etc in last 21 days. She snacks healthy.   Requests Hep C test today due to exposure (spouse recently was treated for hep C  Needs RF on ibuprofen. She takes it for left leg pain due to sciatica. Denies LBP but has hx scoliosis  ALT elevated last month at 72  Past Medical History  Diagnosis Date  . High blood pressure   . Numbness   . Leg pain   . Perimenopause     Past Surgical History  Procedure Laterality Date  . Dilation and curettage of uterus      Uterine abalation  . Tubal ligation      Patient Care Team: Gildardo Cranker, DO as PCP - General (Internal Medicine)  Social History   Social History  . Marital Status: Married    Spouse Name: N/A  . Number of Children: 1  . Years of Education: BS   Occupational History  .      full-time   Social History Main Topics  . Smoking status: Never Smoker   . Smokeless tobacco: Never Used  . Alcohol Use: 3.0 oz/week    6 Standard drinks or equivalent per week     Comment: less than 5 times per week ( beer, wine and hard liquor)  . Drug Use: No  . Sexual Activity: Not on file   Other Topics Concern  . Not on file   Social History Narrative   Patient is married with one child.   Patient is right handed.   Patient has a BS degree.   Patient drinks 1 8 oz drink daily.            Diet: depends daily   Do you drink/eat things with caffeine? Yes   Marital status: Married                             What year were you married? 2012   Do you live in a house,  apartment, assisted living, condo, trailer, etc)?  House   Is it one or more stories? 2 stories   How many persons live in your home? 3   Do you have any pets in your home?No pets   Current or past profession: Hotel manager   Do you exercise?     sometimes                                                Type & how often: Irregular   Do you have a living will? No   Do you have a DNR Form? No   Do you have a POA/HPOA forms? No     reports that she has never smoked. She has never used smokeless tobacco. She reports  that she drinks about 3.0 oz of alcohol per week. She reports that she does not use illicit drugs.  Allergies  Allergen Reactions  . Penicillin G Potassium In D5w   . Sulfa Antibiotics     Medications: Patient's Medications  New Prescriptions   No medications on file  Previous Medications   CETIRIZINE (ZYRTEC) 10 MG TABLET    Take 10 mg by mouth daily.   FLUTICASONE (FLONASE) 50 MCG/ACT NASAL SPRAY    1 spray in both nostrils daily   GABAPENTIN (NEURONTIN) 300 MG CAPSULE    Take 3 capsules (900 mg total) by mouth at bedtime. Start with one at night and can increase to 3 at night as tolerated and needed. Take one to two hours before bedtime.   LEVOFLOXACIN (LEVAQUIN) 750 MG TABLET    Take 750 mg by mouth daily.   OLMESARTAN MEDOXOMIL-HCTZ (BENICAR HCT PO)    Take by mouth.   PHENTERMINE-TOPIRAMATE (QSYMIA) 3.75-23 MG CP24    Take 1 capsule by mouth every day for 14 days in the morning   PHENTERMINE-TOPIRAMATE 3.75-23 MG CP24    Take 1 capsule by mouth daily. Take for 2 weeks ONLY then start 7.5mg  dose   PHENTERMINE-TOPIRAMATE 7.5-46 MG CP24    Take 1 capsule by mouth daily. BEGIN AFTER COMPLETING 3.75/23 dose   VITAMIN D, ERGOCALCIFEROL, (DRISDOL) 50000 UNITS CAPS CAPSULE    Take 1 capsule by mouth every week for 8 weeks   ZOLPIDEM (AMBIEN) 5 MG TABLET    Take 5 mg by mouth at bedtime as needed for sleep.  Modified Medications   Modified Medication Previous Medication    IBUPROFEN (ADVIL,MOTRIN) 800 MG TABLET ibuprofen (ADVIL,MOTRIN) 800 MG tablet      Take 1 tablet (800 mg total) by mouth 3 (three) times daily.    Take 800 mg by mouth 3 (three) times daily.  Discontinued Medications   No medications on file    Review of Systems  Musculoskeletal: Positive for arthralgias (left knee).  All other systems reviewed and are negative.   Filed Vitals:   11/26/15 0919  BP: 110/62  Pulse: 95  Temp: 98.2 F (36.8 C)  TempSrc: Oral  Resp: 20  Height: 5\' 4"  (1.626 m)  Weight: 155 lb 6.4 oz (70.489 kg)  SpO2: 98%   Body mass index is 26.66 kg/(m^2).  Physical Exam  Constitutional: She is oriented to person, place, and time. She appears well-developed and well-nourished. No distress.  Musculoskeletal: She exhibits tenderness.  Neurological: She is alert and oriented to person, place, and time.  Skin: Skin is warm and dry. No rash noted.  Psychiatric: She has a normal mood and affect. Her behavior is normal. Judgment and thought content normal.     Labs reviewed: Office Visit on 10/24/2015  Component Date Value Ref Range Status  . Glucose 10/24/2015 94  65 - 99 mg/dL Final  . BUN 10/24/2015 17  6 - 24 mg/dL Final  . Creatinine, Ser 10/24/2015 0.60  0.57 - 1.00 mg/dL Final  . GFR calc non Af Amer 10/24/2015 108  >59 mL/min/1.73 Final  . GFR calc Af Amer 10/24/2015 125  >59 mL/min/1.73 Final  . BUN/Creatinine Ratio 10/24/2015 28* 9 - 23 Final  . Sodium 10/24/2015 138  134 - 144 mmol/L Final  . Potassium 10/24/2015 3.8  3.5 - 5.2 mmol/L Final  . Chloride 10/24/2015 97  96 - 106 mmol/L Final  . CO2 10/24/2015 24  18 - 29 mmol/L Final  .  Calcium 10/24/2015 10.3* 8.7 - 10.2 mg/dL Final  . Total Protein 10/24/2015 7.7  6.0 - 8.5 g/dL Final  . Albumin 10/24/2015 4.7  3.5 - 5.5 g/dL Final  . Globulin, Total 10/24/2015 3.0  1.5 - 4.5 g/dL Final  . Albumin/Globulin Ratio 10/24/2015 1.6  1.1 - 2.5 Final  . Bilirubin Total 10/24/2015 0.8  0.0 - 1.2 mg/dL  Final  . Alkaline Phosphatase 10/24/2015 71  39 - 117 IU/L Final  . AST 10/24/2015 29  0 - 40 IU/L Final  . ALT 10/24/2015 41* 0 - 32 IU/L Final    No results found.   Assessment/Plan   ICD-9-CM ICD-10-CM   1. Elevated ALT measurement 790.4 R74.0 Hep C Antibody     Hep C Antibody  2. Exposure to hepatitis C V01.79 Z20.5 Hep C Antibody     Hep C Antibody  3. Morbid obesity due to excess calories (HCC) - improving 278.01 E66.01   4. Left-sided low back pain with left-sided sciatica 724.3 M54.42   5. Scoliosis 737.30 M41.9 ibuprofen (ADVIL,MOTRIN) 800 MG tablet   Continue qsymia daily.  Continue diet and exercise program  Will call with results  follow up in 3 mos for routine visit  Sherina Stammer S. Perlie Gold  [facility] [address] [place], [place] [address] [phone] Cell (Monday-Friday 8 AM - 5 PM) [phone] After 5 PM and follow prompts

## 2015-11-26 NOTE — Patient Instructions (Signed)
Continue qsymia daily.  Continue diet and exercise program  Will call with results  follow up in 3 mos for routine visit

## 2015-11-27 LAB — HEPATITIS C ANTIBODY: Hep C Virus Ab: <0.1 s/co ratio (ref 0.0–0.9)

## 2015-12-10 ENCOUNTER — Other Ambulatory Visit: Payer: Self-pay | Admitting: *Deleted

## 2015-12-10 MED ORDER — PHENTERMINE-TOPIRAMATE ER 7.5-46 MG PO CP24: 1.0000 | ORAL_CAPSULE | Freq: Every day | ORAL | Status: DC

## 2015-12-10 NOTE — Telephone Encounter (Signed)
Patient requested and will pick up 

## 2016-01-12 ENCOUNTER — Other Ambulatory Visit: Payer: Self-pay | Admitting: *Deleted

## 2016-01-12 MED ORDER — PHENTERMINE-TOPIRAMATE ER 7.5-46 MG PO CP24: 1.0000 | ORAL_CAPSULE | Freq: Every day | ORAL | Status: DC

## 2016-01-12 NOTE — Telephone Encounter (Signed)
Patient called and requested and will pick up 

## 2016-01-13 ENCOUNTER — Other Ambulatory Visit: Payer: Self-pay | Admitting: Internal Medicine

## 2016-02-12 ENCOUNTER — Other Ambulatory Visit: Payer: Self-pay | Admitting: *Deleted

## 2016-02-12 MED ORDER — PHENTERMINE-TOPIRAMATE ER 7.5-46 MG PO CP24: 1.0000 | ORAL_CAPSULE | Freq: Every day | ORAL | Status: DC

## 2016-02-12 MED ORDER — OLMESARTAN MEDOXOMIL-HCTZ 40-25 MG PO TABS: ORAL_TABLET | ORAL | Status: DC

## 2016-02-12 NOTE — Telephone Encounter (Signed)
Patient requested and will pick up 

## 2016-02-27 ENCOUNTER — Encounter: Payer: Self-pay | Admitting: Internal Medicine

## 2016-02-27 ENCOUNTER — Ambulatory Visit (INDEPENDENT_AMBULATORY_CARE_PROVIDER_SITE_OTHER): Payer: Managed Care, Other (non HMO) | Admitting: Internal Medicine

## 2016-02-27 VITALS — BP 90/70 | HR 97 | Temp 98.0°F | Resp 20 | Ht 64.0 in | Wt 157.8 lb

## 2016-02-27 DIAGNOSIS — E663 Overweight: Secondary | ICD-10-CM

## 2016-02-27 DIAGNOSIS — R740 Nonspecific elevation of levels of transaminase and lactic acid dehydrogenase [LDH]: Secondary | ICD-10-CM

## 2016-02-27 DIAGNOSIS — R7401 Elevation of levels of liver transaminase levels: Secondary | ICD-10-CM

## 2016-02-27 MED ORDER — PHENTERMINE-TOPIRAMATE ER 15-92 MG PO CP24: ORAL_CAPSULE | ORAL | Status: DC

## 2016-02-27 MED ORDER — PHENTERMINE-TOPIRAMATE ER 11.25-69 MG PO CP24
ORAL_CAPSULE | ORAL | Status: DC
Start: 2016-02-27 — End: 2016-02-27

## 2016-02-27 MED ORDER — PHENTERMINE-TOPIRAMATE ER 11.25-69 MG PO CP24: ORAL_CAPSULE | ORAL | Status: DC

## 2016-02-27 NOTE — Patient Instructions (Signed)
Start qsymia 11.25/69 1 cap daily x 2 weeks then fill 15/92 mg dose daily  Continue diet and increase exercise program  Will call with lab results  Follow up in 3 mos for weight mx

## 2016-02-27 NOTE — Progress Notes (Signed)
Patient ID: Anita Brandt, female   DOB: [birth year], 50 y.o.   MRN: [id number]    Location:    P[facility]   Place of Service:   OFFICE  Chief Complaint  Patient presents with  . Medical Management of Chronic Issues    3 mo f/u    HPI:  50 yo female seen today for f/u obesity. She was started on qsymia in Feb 2017. She has gained 2 lbs but [place]. Current BMI 27.07. She completed the Whole 30 diet plan in conjunction with med. She is maintaining a healthy diet. She snacks healthy. She is not exercising as she should.   She has not needed ibuprofen for left leg pain due to sciatica. Denies LBP but has hx scoliosis. She noticed that after completing the Whole 30 diet most of her pain resolved.  ALT elevated. Last level 41   Past Medical History  Diagnosis Date  . High blood pressure   . Numbness   . Leg pain   . Perimenopause     Past Surgical History  Procedure Laterality Date  . Dilation and curettage of uterus      Uterine abalation  . Tubal ligation      Patient Care Team: Gildardo Cranker, DO as PCP - General (Internal Medicine)  Social History   Social History  . Marital Status: Married    Spouse Name: N/A  . Number of Children: 1  . Years of Education: BS   Occupational History  .      full-time   Social History Main Topics  . Smoking status: Never Smoker   . Smokeless tobacco: Never Used  . Alcohol Use: 3.0 oz/week    6 Standard drinks or equivalent per week     Comment: less than 5 times per week ( beer, wine and hard liquor)  . Drug Use: No  . Sexual Activity: Not on file   Other Topics Concern  . Not on file   Social History Narrative   Patient is married with one child.   Patient is right handed.   Patient has a BS degree.   Patient drinks 1 8 oz drink daily.            Diet: depends daily   Do you drink/eat things with caffeine? Yes   Marital status: Married                             What year  were you married? 2012   Do you live in a house, apartment, assisted living, condo, trailer, etc)?  House   Is it one or more stories? 2 stories   How many persons live in your home? 3   Do you have any pets in your home?No pets   Current or past profession: Hotel manager   Do you exercise?     sometimes                                                Type & how often: Irregular   Do you have a living will? No   Do you have a DNR Form? No   Do you have a POA/HPOA forms? No     reports that she has never  smoked. She has never used smokeless tobacco. She reports that she drinks about 3.0 oz of alcohol per week. She reports that she does not use illicit drugs.  Allergies  Allergen Reactions  . Penicillin G Potassium In D5w   . Sulfa Antibiotics     Medications: Patient's Medications  New Prescriptions   No medications on file  Previous Medications   CETIRIZINE (ZYRTEC) 10 MG TABLET    Take 10 mg by mouth daily.   FLUTICASONE (FLONASE) 50 MCG/ACT NASAL SPRAY    SHAKE LIQUID AND USE 1 SPRAY IN EACH NOSTRIL DAILY   GABAPENTIN (NEURONTIN) 300 MG CAPSULE    Take 3 capsules (900 mg total) by mouth at bedtime. Start with one at night and can increase to 3 at night as tolerated and needed. Take one to two hours before bedtime.   IBUPROFEN (ADVIL,MOTRIN) 800 MG TABLET    Take 1 tablet (800 mg total) by mouth 3 (three) times daily.   LEVOFLOXACIN (LEVAQUIN) 750 MG TABLET    Take 750 mg by mouth daily. Reported on 02/27/2016   OLMESARTAN-HYDROCHLOROTHIAZIDE (BENICAR HCT) 40-25 MG TABLET    Take one tablet by mouth once daily   PHENTERMINE-TOPIRAMATE 7.5-46 MG CP24    Take 1 capsule by mouth daily.   ZOLPIDEM (AMBIEN) 5 MG TABLET    Take 5 mg by mouth at bedtime as needed for sleep. Reported on 02/27/2016  Modified Medications   No medications on file  Discontinued Medications   VITAMIN D, ERGOCALCIFEROL, (DRISDOL) 50000 UNITS CAPS CAPSULE    Take 1 capsule by mouth every week for 8 weeks    Review  of Systems  Constitutional: Negative for fever, chills, diaphoresis, activity change, appetite change and fatigue.  HENT: Negative for ear pain and sore throat.   Eyes: Negative for visual disturbance.  Respiratory: Negative for cough, chest tightness and shortness of breath.   Cardiovascular: Negative for chest pain, palpitations and leg swelling.  Gastrointestinal: Negative for nausea, vomiting, abdominal pain, diarrhea, constipation and blood in stool.  Genitourinary: Negative for dysuria.  Musculoskeletal: Negative for arthralgias.  Neurological: Negative for dizziness, tremors, numbness and headaches.  Psychiatric/Behavioral: Negative for sleep disturbance. The patient is not nervous/anxious.     Filed Vitals:   02/27/16 0919  BP: 90/70  Pulse: 97  Temp: 98 F (36.7 C)  TempSrc: Oral  Resp: 20  Height: 5\' 4"  (1.626 m)  Weight: 157 lb 12.8 oz (71.578 kg)  SpO2: 99%   Body mass index is 27.07 kg/(m^2).  Physical Exam  Constitutional: She is oriented to person, place, and time. She appears well-developed and well-nourished.  HENT:  Mouth/Throat: Oropharynx is clear and moist. No oropharyngeal exudate.  Eyes: Pupils are equal, round, and reactive to light. No scleral icterus.  Neck: Neck supple. Carotid bruit is not present. No tracheal deviation present. No thyromegaly present.  Cardiovascular: Normal rate, regular rhythm, normal heart sounds and intact distal pulses.  Exam reveals no gallop and no friction rub.   No murmur heard. No LE edema b/l. no calf TTP.   Pulmonary/Chest: Effort normal and breath sounds normal. No stridor. No respiratory distress. She has no wheezes. She has no rales.  Abdominal: Soft. Bowel sounds are normal. She exhibits no distension and no mass. There is no hepatomegaly. There is no tenderness. There is no rebound and no guarding.  Lymphadenopathy:    She has no cervical adenopathy.  Neurological: She is alert and oriented to person, place, and  time.  Skin: Skin is warm and dry. No rash noted.  Psychiatric: She has a normal mood and affect. Her behavior is normal. Judgment and thought content normal.     Labs reviewed: No visits with results within 3 Month(s) from this visit. Latest known visit with results is:  Office Visit on 11/26/2015  Component Date Value Ref Range Status  . Hep C Virus Ab 11/26/2015 <0.1  0.0 - 0.9 s/co ratio Final   Comment:                                   Negative:     < 0.8                              Indeterminate: 0.8 - 0.9                                   Positive:     > 0.9  The CDC recommends that a positive HCV antibody result  be followed up with a HCV Nucleic Acid Amplification  test [id number]).     No results found.   Assessment/Plan   ICD-9-CM ICD-10-CM   1. Overweight (BMI 25.0-29.9) 278.02 E66.3 Phentermine-Topiramate (QSYMIA) 11.25-69 MG CP24     Phentermine-Topiramate 15-92 MG CP24  2. Elevated ALT measurement 790.4 R74.0 Hepatic Function Panel   Start qsymia 11.25/69 1 cap daily x 2 weeks then fill 15/92 mg dose daily  Continue diet and increase exercise program  Will call with lab results  Follow up in 3 mos for weight mx  Jaiyon Wander S. Perlie Gold  [facility] [address] [place], [place] [address] [phone] Cell (Monday-Friday 8 AM - 5 PM) [phone] After 5 PM and follow prompts

## 2016-02-28 LAB — HEPATIC FUNCTION PANEL
ALT: 24 IU/L (ref 0–32)
AST: 22 IU/L (ref 0–40)
Albumin: 4.9 g/dL (ref 3.5–5.5)
Alkaline Phosphatase: 69 IU/L (ref 39–117)
BILIRUBIN, DIRECT: 0.19 mg/dL (ref 0.00–0.40)
Bilirubin Total: 0.7 mg/dL (ref 0.0–1.2)
TOTAL PROTEIN: 8.4 g/dL (ref 6.0–8.5)

## 2016-06-02 ENCOUNTER — Ambulatory Visit: Payer: Managed Care, Other (non HMO) | Admitting: Internal Medicine

## 2016-06-17 ENCOUNTER — Other Ambulatory Visit: Payer: Self-pay | Admitting: Internal Medicine

## 2016-07-07 ENCOUNTER — Ambulatory Visit (INDEPENDENT_AMBULATORY_CARE_PROVIDER_SITE_OTHER): Payer: Managed Care, Other (non HMO) | Admitting: Internal Medicine

## 2016-07-07 ENCOUNTER — Encounter: Payer: Self-pay | Admitting: Internal Medicine

## 2016-07-07 VITALS — BP 112/70 | HR 66 | Temp 97.7°F | Ht 64.0 in | Wt 161.0 lb

## 2016-07-07 DIAGNOSIS — E663 Overweight: Secondary | ICD-10-CM | POA: Diagnosis not present

## 2016-07-07 DIAGNOSIS — J301 Allergic rhinitis due to pollen: Secondary | ICD-10-CM | POA: Diagnosis not present

## 2016-07-07 DIAGNOSIS — I10 Essential (primary) hypertension: Secondary | ICD-10-CM

## 2016-07-07 DIAGNOSIS — G4700 Insomnia, unspecified: Secondary | ICD-10-CM | POA: Diagnosis not present

## 2016-07-07 LAB — BASIC METABOLIC PANEL WITH GFR
BUN: 19 mg/dL (ref 7–25)
CO2: 25 mmol/L (ref 20–31)
Calcium: 9.9 mg/dL (ref 8.6–10.2)
Chloride: 107 mmol/L (ref 98–110)
Creat: 0.68 mg/dL (ref 0.50–1.10)
GFR, Est African American: >89 mL/min (ref >=60)
GLUCOSE: 72 mg/dL (ref 65–99)
POTASSIUM: 3.7 mmol/L (ref 3.5–5.3)
Sodium: 140 mmol/L (ref 135–146)

## 2016-07-07 LAB — ALT: ALT: 29 U/L (ref 6–29)

## 2016-07-07 MED ORDER — PHENTERMINE-TOPIRAMATE ER 15-92 MG PO CP24: ORAL_CAPSULE | ORAL | 2 refills | Status: DC

## 2016-07-07 MED ORDER — ZOLPIDEM TARTRATE 5 MG PO TABS: 5.0000 mg | ORAL_TABLET | Freq: Every evening | ORAL | 1 refills | Status: DC | PRN

## 2016-07-07 NOTE — Progress Notes (Signed)
Patient ID: CHARMIAN FORBIS, female   DOB: June 20, 1966, 50 y.o.   MRN: [id number]    Location:  [facility] Place of Service: OFFICE  Chief Complaint  Patient presents with  . Medical Management of Chronic Issues    3 month follow up  . flu vacc    refused    HPI:  50 yo female seen today for f/u. She is c/a spouse who was recently dx with tonsillar cancer. She has been eating more over the last several weeks. She has been out of qsymia x 2-3 weeks. She gained 4 lbs since last OV. She has seasonal allergy sx's. No other concerns.   obesity - She was started on qsymia in Feb 2017. Current BMI 27.64. She completed the Whole 30 diet plan in conjunction with med. She is not maintaining a healthy diet. She snacks. She is not exercising as she should.   She has not needed ibuprofen for left leg pain due to sciatica. Denies LBP but has hx scoliosis. She noticed that after completing the Whole 30 diet most of her pain resolved.  ALT elevated. Last level 41   Past Medical History:  Diagnosis Date  . High blood pressure   . Leg pain   . Numbness   . Perimenopause     Past Surgical History:  Procedure Laterality Date  . DILATION AND CURETTAGE OF UTERUS     Uterine abalation  . TUBAL LIGATION      Patient Care Team: Gildardo Cranker, DO as PCP - General (Internal Medicine)  Social History   Social History  . Marital status: Married    Spouse name: N/A  . Number of children: 1  . Years of education: BS   Occupational History  .  [employer]    full-time   Social History Main Topics  . Smoking status: Never Smoker  . Smokeless tobacco: Never Used  . Alcohol use 3.0 oz/week    6 Standard drinks or equivalent per week     Comment: less than 5 times per week ( beer, wine and hard liquor)  . Drug use: No  . Sexual activity: Not on file   Other Topics Concern  . Not on file   Social History Narrative   Patient is married with one child.   Patient is right handed.   Patient has  a BS degree.   Patient drinks 1 8 oz drink daily.            Diet: depends daily   Do you drink/eat things with caffeine? Yes   Marital status: Married                             What year were you married? 2012   Do you live in a house, apartment, assisted living, condo, trailer, etc)?  House   Is it one or more stories? 2 stories   How many persons live in your home? 3   Do you have any pets in your home?No pets   Current or past profession: QA Analyst   Do you exercise?     sometimes                                                Type & how often: Irregular   Do you  have a living will? No   Do you have a DNR Form? No   Do you have a POA/HPOA forms? No     reports that she has never smoked. She has never used smokeless tobacco. She reports that she drinks about 3.0 oz of alcohol per week . She reports that she does not use drugs.  Family History  Problem Relation Age of Onset  . Cancer Father     lung cancer  . Hypertension Father   . Hypertension Mother   . Arthritis Mother   . Cancer Maternal Grandmother   . COPD Paternal Aunt    Family Status  Relation Status  . Father Deceased at age 73   Lung cancer  . Mother Alive  . Brother Alive  . Son Alive  . Maternal Grandmother   . Paternal Aunt      Allergies  Allergen Reactions  . Penicillin G Potassium In D5w   . Sulfa Antibiotics     Medications: Patient's Medications  New Prescriptions   No medications on file  Previous Medications   CETIRIZINE (ZYRTEC) 10 MG TABLET    Take 10 mg by mouth daily.   FLUTICASONE (FLONASE) 50 MCG/ACT NASAL SPRAY    SHAKE LIQUID AND USE 1 SPRAY IN EACH NOSTRIL DAILY   IBUPROFEN (ADVIL,MOTRIN) 800 MG TABLET    Take 800 mg by mouth as needed.   MONTELUKAST (SINGULAIR) 10 MG TABLET    Take 10 mg by mouth at bedtime.   OLMESARTAN-HYDROCHLOROTHIAZIDE (BENICAR HCT) 40-25 MG TABLET    TAKE ONE TABLET BY MOUTH ONCE DAILY   PHENTERMINE-TOPIRAMATE (QSYMIA) 11.25-69 MG CP24    Take 1 cap  po daily x 2 weeks   PHENTERMINE-TOPIRAMATE 15-92 MG CP24    1 cap po daily   ZOLPIDEM (AMBIEN) 5 MG TABLET    Take 5 mg by mouth at bedtime as needed for sleep. Reported on 02/27/2016  Modified Medications   No medications on file  Discontinued Medications   GABAPENTIN (NEURONTIN) 300 MG CAPSULE    Take 3 capsules (900 mg total) by mouth at bedtime. Start with one at night and can increase to 3 at night as tolerated and needed. Take one to two hours before bedtime.   IBUPROFEN (ADVIL,MOTRIN) 800 MG TABLET    Take 1 tablet (800 mg total) by mouth 3 (three) times daily.    Review of Systems  Allergic/Immunologic: Positive for environmental allergies.  Psychiatric/Behavioral: Positive for sleep disturbance.  All other systems reviewed and are negative.   Vitals:   07/07/16 0822  BP: 112/70  Pulse: 66  Temp: 97.7 F (36.5 C)  TempSrc: Oral  SpO2: 99%  Weight: 161 lb (73 kg)  Height: 5' 4"  (1.626 m)   Body mass index is 27.64 kg/m.  Physical Exam  Constitutional: She is oriented to person, place, and time. She appears well-developed and well-nourished.  HENT:  Mouth/Throat: Oropharynx is clear and moist. No oropharyngeal exudate.  Eyes: Pupils are equal, round, and reactive to light. No scleral icterus.  Neck: Neck supple. Carotid bruit is not present. No tracheal deviation present. No thyromegaly present.  Cardiovascular: Normal rate, regular rhythm, normal heart sounds and intact distal pulses.  Exam reveals no gallop and no friction rub.   No murmur heard. No LE edema b/l. no calf TTP.   Pulmonary/Chest: Effort normal and breath sounds normal. No stridor. No respiratory distress. She has no wheezes. She has no rales.  Abdominal: Soft. Bowel sounds are normal.  She exhibits no distension and no mass. There is no hepatomegaly. There is no tenderness. There is no rebound and no guarding.  Lymphadenopathy:    She has no cervical adenopathy.  Neurological: She is alert and oriented  to person, place, and time.  Skin: Skin is warm and dry. No rash noted.  Psychiatric: She has a normal mood and affect. Her behavior is normal. Judgment and thought content normal.     Labs reviewed: No visits with results within 3 Month(s) from this visit.  Latest known visit with results is:  Office Visit on 02/27/2016  Component Date Value Ref Range Status  . Total Protein 02/28/2016 8.4  6.0 - 8.5 g/dL Final  . Albumin 02/28/2016 4.9  3.5 - 5.5 g/dL Final  . Bilirubin Total 02/28/2016 0.7  0.0 - 1.2 mg/dL Final  . Bilirubin, Direct 02/28/2016 0.19  0.00 - 0.40 mg/dL Final  . Alkaline Phosphatase 02/28/2016 69  39 - 117 IU/L Final  . AST 02/28/2016 22  0 - 40 IU/L Final  . ALT 02/28/2016 24  0 - 32 IU/L Final    No results found.   Assessment/Plan   ICD-9-CM ICD-10-CM   1. Overweight (BMI 25.0-29.9) 278.02 E66.3 Phentermine-Topiramate 15-92 MG CP24     BMP with eGFR     ALT  2. Insomnia 780.52 G47.00 zolpidem (AMBIEN) 5 MG tablet  3. Allergic rhinitis due to pollen, unspecified rhinitis seasonality 477.0 J30.1   4. Benign essential HTN 401.1 I10 BMP with eGFR     ALT    Declined flu shot  Resume qsymia as ordered  Continue other medications as ordered  Will call with lab results  Follow up in 3 mos for CPE   [facility] S. Perlie Gold  [facility] [address] [place], [place] [address] [phone] Cell (Monday-Friday 8 AM - 5 PM) [phone] After 5 PM and follow prompts

## 2016-07-07 NOTE — Patient Instructions (Signed)
Resume qsymia as ordered  Continue other medications as ordered  Will call with lab results  Follow up in 3 mos for CPE

## 2016-10-07 ENCOUNTER — Ambulatory Visit (INDEPENDENT_AMBULATORY_CARE_PROVIDER_SITE_OTHER): Payer: Managed Care, Other (non HMO) | Admitting: Nurse Practitioner

## 2016-10-07 ENCOUNTER — Encounter: Payer: Self-pay | Admitting: Nurse Practitioner

## 2016-10-07 VITALS — BP 124/76 | HR 92 | Temp 98.0°F | Resp 18 | Ht 64.0 in | Wt 157.6 lb

## 2016-10-07 DIAGNOSIS — H00015 Hordeolum externum left lower eyelid: Secondary | ICD-10-CM

## 2016-10-07 MED ORDER — POLYMYXIN B-TRIMETHOPRIM 10000-0.1 UNIT/ML-% OP SOLN: 1.0000 [drp] | OPHTHALMIC | 0 refills | Status: DC

## 2016-10-07 NOTE — Patient Instructions (Addendum)
Wash eyes with Johnson's baby shampoo three times a day. Be sure to use a different part of the rag for each eye.   Warm compress four times a day for 15 minutes each time.   Eye drops every 3-4 hours with a maximum of 6/day.   Throw away old eye make-up and purchase new to prevent re-infecting yourself.   Discard current contacts. Wear glasses until stye has healed.   Make sure you are washing your hands often, especially when messing with your eyes.     Stye A stye is a bump on your eyelid caused by a bacterial infection. A stye can form inside the eyelid (internal stye) or outside the eyelid (external stye). An internal stye may be caused by an infected oil-producing gland inside your eyelid. An external stye may be caused by an infection at the base of your eyelash (hair follicle). Styes are very common. Anyone can get them at any age. They usually occur in just one eye, but you may have more than one in either eye. What are the causes? The infection is almost always caused by bacteria called Staphylococcus aureus. This is a common type of bacteria that lives on your skin. What increases the risk? You may be at higher risk for a stye if you have had one before. You may also be at higher risk if you have:  Diabetes.  Long-term illness.  Long-term eye redness.  A skin condition called seborrhea.  High fat levels in your blood (lipids). What are the signs or symptoms? Eyelid pain is the most common symptom of a stye. Internal styes are more painful than external styes. Other signs and symptoms may include:  Painful swelling of your eyelid.  A scratchy feeling in your eye.  Tearing and redness of your eye.  Pus draining from the stye. How is this diagnosed? Your health care provider may be able to diagnose a stye just by examining your eye. The health care provider may also check to make sure:  You do not have a fever or other signs of a more serious infection.  The  infection has not spread to other parts of your eye or areas around your eye. How is this treated? Most styes will clear up in a few days without treatment. In some cases, you may need to use antibiotic drops or ointment to prevent infection. Your health care provider may have to drain the stye surgically if your stye is:  Large.  Causing a lot of pain.  Interfering with your vision. This can be done using a thin blade or a needle. Follow these instructions at home:  Take medicines only as directed by your health care provider.  Apply a clean, warm compress to your eye for 10 minutes, 4 times a day.  Do not wear contact lenses or eye makeup until your stye has healed.  Do not try to pop or drain the stye. Contact a health care provider if:  You have chills or a fever.  Your stye does not go away after several days.  Your stye affects your vision.  Your eyeball becomes swollen, red, or painful. This information is not intended to replace advice given to you by your health care provider. Make sure you discuss any questions you have with your health care provider. Document Released: 07/21/2005 Document Revised: 06/06/2016 Document Reviewed: 01/25/2014 Elsevier Interactive Patient Education  2017 [employer].

## 2016-10-07 NOTE — Progress Notes (Signed)
Careteam: Patient Care Team: Gildardo Cranker, DO as PCP - General (Internal Medicine)  Advanced Directive information Does Patient Have a Medical Advance Directive?: No, Would patient like information on creating a medical advance directive?: Yes (MAU/Ambulatory/Procedural Areas - Information given) (Has paperwork but has not completed them yet)  Allergies  Allergen Reactions  . Penicillin G Potassium In D5w   . Sulfa Antibiotics     Chief Complaint  Patient presents with  . Acute Visit    Left eye soreness/swelling x 4 days. Right eye became sore in corner this morning. No discharge from either eye. Sinuses have been very dry x 1 month     HPI: Patient is a 50 y.o. female seen in the office today with c/o of eye soreness. Left eye soreness started in lower outer corner eyelid 3 days ago, swelling was present this morning when the patient woke up. She applied heat and ice to the area. Throughout the day she feels like the swelling is moving down her face. Now the same feeling is beginning in the right eye at the lower outer corner eyelid. Tightness/pain when she blinks - felt to be related to the swelling. No trauma, no chemicals in contact with the eyes. No discharge present, no itching, blurred vision, or sensitivity to light. Wears contacts that she changes monthly - just changed 2 weeks ago. Does remove them at night. No issues with contacts.    Review of Systems:  Review of Systems  Eyes: Negative for photophobia, pain, discharge, redness, itching and visual disturbance.       Soreness to lower outer eyelids with swelling of left lower eyelid.  All other systems reviewed and are negative.   Past Medical History:  Diagnosis Date  . High blood pressure   . Leg pain   . Numbness   . Perimenopause    Past Surgical History:  Procedure Laterality Date  . DILATION AND CURETTAGE OF UTERUS     Uterine abalation  . TUBAL LIGATION     Social History:   reports that she has  never smoked. She has never used smokeless tobacco. She reports that she drinks about 3.0 oz of alcohol per week . She reports that she does not use drugs.  Family History  Problem Relation Age of Onset  . Cancer Father     lung cancer  . Hypertension Father   . Hypertension Mother   . Arthritis Mother   . Cancer Maternal Grandmother   . COPD Paternal Aunt     Medications: Patient's Medications  New Prescriptions   TRIMETHOPRIM-POLYMYXIN B (POLYTRIM) OPHTHALMIC SOLUTION    Place 1 drop into both eyes every 4 (four) hours.  Previous Medications   CETIRIZINE (ZYRTEC) 10 MG TABLET    Take 10 mg by mouth daily.   FLUTICASONE (FLONASE) 50 MCG/ACT NASAL SPRAY    SHAKE LIQUID AND USE 1 SPRAY IN EACH NOSTRIL DAILY   IBUPROFEN (ADVIL,MOTRIN) 800 MG TABLET    Take 800 mg by mouth as needed.   MONTELUKAST (SINGULAIR) 10 MG TABLET    Take 10 mg by mouth at bedtime.   OLMESARTAN-HYDROCHLOROTHIAZIDE (BENICAR HCT) 40-25 MG TABLET    TAKE ONE TABLET BY MOUTH ONCE DAILY   PHENTERMINE-TOPIRAMATE 15-92 MG CP24    1 cap po daily   ZOLPIDEM (AMBIEN) 5 MG TABLET    Take 1 tablet (5 mg total) by mouth at bedtime as needed for sleep.  Modified Medications   No medications on file  Discontinued Medications   No medications on file     Physical Exam:  Vitals:   10/07/16 1339  BP: 124/76  Pulse: 92  Resp: 18  Temp: 98 F (36.7 C)  TempSrc: Oral  SpO2: 98%  Weight: 157 lb 9.6 oz (71.5 kg)  Height: 5\' 4"  (1.626 m)   Body mass index is 27.05 kg/m.  Physical Exam  Constitutional: She is oriented to person, place, and time. She appears well-developed and well-nourished.  HENT:  Head: Normocephalic.  Eyes: Conjunctivae are normal. Pupils are equal, round, and reactive to light. Right eye exhibits no discharge and no exudate. No foreign body present in the right eye. Left eye exhibits hordeolum. Left eye exhibits no discharge and no exudate. No foreign body present in the left eye.  Firm nodule  left lower outer eyelid that is painful to palpation with surrounding swelling and discoloration.   Neck: Normal range of motion. Neck supple. No thyromegaly present.  Cardiovascular: Normal rate, regular rhythm and normal heart sounds.   Pulmonary/Chest: Effort normal and breath sounds normal.  Abdominal: Soft. Bowel sounds are normal.  Lymphadenopathy:    She has no cervical adenopathy.  Neurological: She is alert and oriented to person, place, and time.  Skin: Skin is warm and dry.  Psychiatric: She has a normal mood and affect. Her behavior is normal. Judgment and thought content normal.    Labs reviewed: Basic Metabolic Panel:  Recent Labs  10/24/15 0956 07/07/16 0916  NA 138 140  K 3.8 3.7  CL 97 107  CO2 24 25  GLUCOSE 94 72  BUN 17 19  CREATININE 0.60 0.68  CALCIUM 10.3* 9.9   Liver Function Tests:  Recent Labs  10/24/15 0956 02/27/16 1010 07/07/16 0916  AST 29 22  --   ALT 41* 24 29  ALKPHOS 71 69  --   BILITOT 0.8 0.7  --   PROT 7.7 8.4  --   ALBUMIN 4.7 4.9  --    No results for input(s): LIPASE, AMYLASE in the last 8760 hours. No results for input(s): AMMONIA in the last 8760 hours. CBC: No results for input(s): WBC, NEUTROABS, HGB, HCT, MCV, PLT in the last 8760 hours. Lipid Panel: No results for input(s): CHOL, HDL, LDLCALC, TRIG, CHOLHDL, LDLDIRECT in the last 8760 hours. TSH: No results for input(s): TSH in the last 8760 hours. A1C: Lab Results  Component Value Date   HGBA1C 5.3 06/19/2014     Assessment/Plan 1. Hordeolum externum of left lower eyelid - Wash eyes with Johnson's baby shampoo three times a day. Be sure to use a different part of the rag for each eye.  - Warm compress four times a day for 15 minutes each time.  - Throw away old eye make-up and purchase new to prevent re-infecting yourself.  - Discard current contacts. Wear glasses until stye has healed.  - Make sure you are washing your hands often, especially when messing  with your eyes. - Education provided on hordeolums. - trimethoprim-polymyxin b (POLYTRIM) ophthalmic solution; Place 1 drop into both eyes every 4 (four) hours.  Dispense: 10 mL; Refill: 0   If your eye worsens or does not improve notify office or see your ophthalmologist.   Carlos American. Harle Battiest  [facility] [phone] 8 am - 5 pm) [phone] (after hours)

## 2016-10-07 NOTE — Progress Notes (Signed)
Careteam: Patient Care Team: Gildardo Cranker, DO as PCP - General (Internal Medicine)  Advanced Directive information Does Patient Have a Medical Advance Directive?: No, Would patient like information on creating a medical advance directive?: Yes (MAU/Ambulatory/Procedural Areas - Information given) (Has paperwork but has not completed them yet)  Allergies  Allergen Reactions  . Penicillin G Potassium In D5w   . Sulfa Antibiotics     Chief Complaint  Patient presents with  . Acute Visit    Left eye soreness/swelling x 4 days. Right eye became sore in corner this morning. No discharge from either eye.     HPI: Patient is a 50 y.o. female seen in the office today with c/o of eye soreness. Left eye soreness started in lower outer corner eyelid 3 days ago, swelling was present this morning when the patient woke up. She applied heat and ice to the area. Throughout the day she feels like the swelling is moving down her face. Now the same feeling is beginning in the right eye at the lower outer corner eyelid. Tightness/pain when she blinks - felt to be related to the swelling. No trauma, no chemicals in contact with the eyes. No discharge present, no itching, blurred vision, or sensitivity to light. Wears contacts that she changes monthly - just changed 2 weeks ago. Does remove them at night. No issues with contacts.    Review of Systems:  Review of Systems  Eyes: Negative for photophobia, pain, discharge, redness, itching and visual disturbance.       Soreness to lower outer eyelids with swelling of left lower eyelid.  All other systems reviewed and are negative.   Past Medical History:  Diagnosis Date  . High blood pressure   . Leg pain   . Numbness   . Perimenopause    Past Surgical History:  Procedure Laterality Date  . DILATION AND CURETTAGE OF UTERUS     Uterine abalation  . TUBAL LIGATION     Social History:   reports that she has never smoked. She has never used  smokeless tobacco. She reports that she drinks about 3.0 oz of alcohol per week . She reports that she does not use drugs.  Family History  Problem Relation Age of Onset  . Cancer Father     lung cancer  . Hypertension Father   . Hypertension Mother   . Arthritis Mother   . Cancer Maternal Grandmother   . COPD Paternal Aunt     Medications: Patient's Medications  New Prescriptions   TRIMETHOPRIM-POLYMYXIN B (POLYTRIM) OPHTHALMIC SOLUTION    Place 1 drop into both eyes every 4 (four) hours.  Previous Medications   CETIRIZINE (ZYRTEC) 10 MG TABLET    Take 10 mg by mouth daily.   FLUTICASONE (FLONASE) 50 MCG/ACT NASAL SPRAY    SHAKE LIQUID AND USE 1 SPRAY IN EACH NOSTRIL DAILY   IBUPROFEN (ADVIL,MOTRIN) 800 MG TABLET    Take 800 mg by mouth as needed.   MONTELUKAST (SINGULAIR) 10 MG TABLET    Take 10 mg by mouth at bedtime.   OLMESARTAN-HYDROCHLOROTHIAZIDE (BENICAR HCT) 40-25 MG TABLET    TAKE ONE TABLET BY MOUTH ONCE DAILY   PHENTERMINE-TOPIRAMATE 15-92 MG CP24    1 cap po daily   ZOLPIDEM (AMBIEN) 5 MG TABLET    Take 1 tablet (5 mg total) by mouth at bedtime as needed for sleep.  Modified Medications   No medications on file  Discontinued Medications   No medications on  file     Physical Exam:  Vitals:   10/07/16 1339  BP: 124/76  Pulse: 92  Resp: 18  Temp: 98 F (36.7 C)  TempSrc: Oral  SpO2: 98%  Weight: 157 lb 9.6 oz (71.5 kg)  Height: 5\' 4"  (1.626 m)   Body mass index is 27.05 kg/m.  Physical Exam  Constitutional: She is oriented to person, place, and time. She appears well-developed and well-nourished.  HENT:  Head: Normocephalic.  Eyes: Conjunctivae are normal. Pupils are equal, round, and reactive to light. Right eye exhibits no discharge and no exudate. No foreign body present in the right eye. Left eye exhibits hordeolum. Left eye exhibits no discharge and no exudate. No foreign body present in the left eye.  Firm nodule left lower outer eyelid that is  painful to palpation with surrounding swelling and discoloration.   Neck: Normal range of motion. Neck supple. No thyromegaly present.  Cardiovascular: Normal rate, regular rhythm and normal heart sounds.   Pulmonary/Chest: Effort normal and breath sounds normal.  Abdominal: Soft. Bowel sounds are normal.  Lymphadenopathy:    She has no cervical adenopathy.  Neurological: She is alert and oriented to person, place, and time.  Skin: Skin is warm and dry.  Psychiatric: She has a normal mood and affect. Her behavior is normal. Judgment and thought content normal.    Labs reviewed: Basic Metabolic Panel:  Recent Labs  10/24/15 0956 07/07/16 0916  NA 138 140  K 3.8 3.7  CL 97 107  CO2 24 25  GLUCOSE 94 72  BUN 17 19  CREATININE 0.60 0.68  CALCIUM 10.3* 9.9   Liver Function Tests:  Recent Labs  10/24/15 0956 02/27/16 1010 07/07/16 0916  AST 29 22  --   ALT 41* 24 29  ALKPHOS 71 69  --   BILITOT 0.8 0.7  --   PROT 7.7 8.4  --   ALBUMIN 4.7 4.9  --    No results for input(s): LIPASE, AMYLASE in the last 8760 hours. No results for input(s): AMMONIA in the last 8760 hours. CBC: No results for input(s): WBC, NEUTROABS, HGB, HCT, MCV, PLT in the last 8760 hours. Lipid Panel: No results for input(s): CHOL, HDL, LDLCALC, TRIG, CHOLHDL, LDLDIRECT in the last 8760 hours. TSH: No results for input(s): TSH in the last 8760 hours. A1C: Lab Results  Component Value Date   HGBA1C 5.3 06/19/2014     Assessment/Plan 1. Hordeolum externum of left lower eyelid - Wash eyes with Johnson's baby shampoo three times a day. Be sure to use a different part of the rag for each eye.  - Warm compress four times a day for 15 minutes each time.  - Throw away old eye make-up and purchase new to prevent re-infecting yourself.  - Discard current contacts. Wear glasses until stye has healed.  - Make sure you are washing your hands often, especially when messing with your eyes. - Education  provided on hordeolums. - trimethoprim-polymyxin b (POLYTRIM) ophthalmic solution; Place 1 drop into both eyes every 4 (four) hours.  Dispense: 10 mL; Refill: 0   If your eye worsens or does not improve notify office or see your ophthalmologist.   Carlos American. Harle Battiest  [facility] [phone] 8 am - 5 pm) [phone] (after hours)

## 2016-10-14 ENCOUNTER — Other Ambulatory Visit: Payer: Self-pay | Admitting: Internal Medicine

## 2016-10-20 ENCOUNTER — Encounter: Payer: Managed Care, Other (non HMO) | Admitting: Internal Medicine

## 2016-10-20 ENCOUNTER — Ambulatory Visit (INDEPENDENT_AMBULATORY_CARE_PROVIDER_SITE_OTHER): Payer: Managed Care, Other (non HMO) | Admitting: Internal Medicine

## 2016-10-20 ENCOUNTER — Encounter: Payer: Self-pay | Admitting: Internal Medicine

## 2016-10-20 VITALS — BP 112/78 | HR 78 | Temp 97.9°F | Ht 64.0 in | Wt 159.2 lb

## 2016-10-20 DIAGNOSIS — Z0000 Encounter for general adult medical examination without abnormal findings: Secondary | ICD-10-CM

## 2016-10-20 DIAGNOSIS — J301 Allergic rhinitis due to pollen: Secondary | ICD-10-CM | POA: Diagnosis not present

## 2016-10-20 DIAGNOSIS — E663 Overweight: Secondary | ICD-10-CM | POA: Diagnosis not present

## 2016-10-20 DIAGNOSIS — H6121 Impacted cerumen, right ear: Secondary | ICD-10-CM | POA: Diagnosis not present

## 2016-10-20 DIAGNOSIS — I10 Essential (primary) hypertension: Secondary | ICD-10-CM

## 2016-10-20 LAB — CBC WITH DIFFERENTIAL/PLATELET
BASOS ABS: 0 {cells}/uL (ref 0–200)
Basophils Relative: 0 %
Eosinophils Absolute: 54 cells/uL (ref 15–500)
Eosinophils Relative: 1 %
HEMATOCRIT: 36.1 % (ref 35.0–45.0)
Hemoglobin: 11.8 g/dL (ref 11.7–15.5)
LYMPHS ABS: 2052 {cells}/uL (ref 850–3900)
Lymphocytes Relative: 38 %
MCH: 32.1 pg (ref 27.0–33.0)
MCHC: 32.7 g/dL (ref 32.0–36.0)
MCV: 98.1 fL (ref 80.0–100.0)
MONO ABS: 540 {cells}/uL (ref 200–950)
MPV: 9.4 fL (ref 7.5–12.5)
Monocytes Relative: 10 %
NEUTROS PCT: 51 %
Neutro Abs: 2754 cells/uL (ref 1500–7800)
Platelets: 348 10*3/uL (ref 140–400)
RBC: 3.68 MIL/uL — AB (ref 3.80–5.10)
RDW: 13.5 % (ref 11.0–15.0)
WBC: 5.4 10*3/uL (ref 3.8–10.8)

## 2016-10-20 LAB — LIPID PANEL
Cholesterol: 203 mg/dL — ABNORMAL HIGH (ref <200)
HDL: 98 mg/dL (ref >50)
LDL Cholesterol: 94 mg/dL (ref <100)
TRIGLYCERIDES: 55 mg/dL (ref <150)
Total CHOL/HDL Ratio: 2.1 Ratio (ref <5.0)
VLDL: 11 mg/dL (ref <30)

## 2016-10-20 LAB — COMPLETE METABOLIC PANEL WITH GFR
ALBUMIN: 4.4 g/dL (ref 3.6–5.1)
ALK PHOS: 74 U/L (ref 33–115)
ALT: 25 U/L (ref 6–29)
AST: 24 U/L (ref 10–35)
BILIRUBIN TOTAL: 0.5 mg/dL (ref 0.2–1.2)
BUN: 18 mg/dL (ref 7–25)
CO2: 21 mmol/L (ref 20–31)
Calcium: 9.9 mg/dL (ref 8.6–10.2)
Chloride: 104 mmol/L (ref 98–110)
Creat: 0.65 mg/dL (ref 0.50–1.10)
Glucose, Bld: 84 mg/dL (ref 65–99)
Potassium: 4.1 mmol/L (ref 3.5–5.3)
Sodium: 141 mmol/L (ref 135–146)
TOTAL PROTEIN: 7.8 g/dL (ref 6.1–8.1)

## 2016-10-20 LAB — TSH: TSH: 0.67 mIU/L

## 2016-10-20 MED ORDER — PHENTERMINE-TOPIRAMATE ER 15-92 MG PO CP24: ORAL_CAPSULE | ORAL | 3 refills | Status: DC

## 2016-10-20 NOTE — Addendum Note (Signed)
Addended by: Moshe Cipro, MESHELL A on: 10/20/2016 11:01 AM   Modules accepted: Orders

## 2016-10-20 NOTE — Progress Notes (Signed)
Patient ID: Anita Brandt, female   DOB: [birth year], 50 y.o.   MRN: [id number]   Location:  [facility]  Place of Service:  OFFICE  Provider: Arletha Grippe, DO  Patient Care Team: Gildardo Cranker, DO as PCP - General (Internal Medicine)  Extended Emergency Contact Information Primary Emergency Contact: [facility] Address: [address]          [place], [place] [address] Johnnette Litter of [place] Phone: [phone] Mobile Phone: [phone] Relation: Spouse  Code Status: FULL CODE Goals of Care: Advanced Directive information Advanced Directives 10/20/2016  Does Patient Have a Medical Advance Directive? No  Would patient like information on creating a medical advance directive? No - Patient declined     Chief Complaint  Patient presents with  . Annual Exam    Yearly Exam    HPI: Patient is a 50 y.o. female seen in today for an annual wellness exam.  She has been caring for her spouse who is undergoing tx for tonsillar cancer. She plans to return to work next week. Spouse doing better. She lost 2 lbs since Sept 2017 OV.   She has seasonal allergy sx's and takes zyrtec, flonase and singulair.  Obesity - She was started on qsymia in Feb 2017. Current BMI 27.33. She completed the Whole 30 diet plan in conjunction with med. She is not maintaining a healthy diet. She snacks. She is not exercising as she should.   She has not needed ibuprofen for left leg pain due to sciatica. Denies LBP but has hx scoliosis. She noticed that after completing the Whole 30 diet most of her pain resolved.  ALT elevated hx -  Last level nml at 29  HTN - BP stable on benicar hct   Depression screen [facility] 2/9 10/20/2016 10/24/2015  Decreased Interest 0 0  Down, Depressed, Hopeless 0 0  PHQ - 2 Score 0 0    Fall Risk  10/20/2016 10/07/2016 07/07/2016 10/24/2015  Falls in the past year? No No No No   No flowsheet data found.   Health Maintenance  Topic Date Due  . TETANUS/TDAP   06/12/1986  . INFLUENZA VACCINE  05/25/2016  . PAP SMEAR  10/31/2017  . HIV Screening  Completed      Exercise? No regular routine  Diet? Making poor food choices due to spouse's illness  No exam data present  Hearing: no issues  Pain: none  Past Medical History:  Diagnosis Date  . High blood pressure   . Leg pain   . Numbness   . Perimenopause     Past Surgical History:  Procedure Laterality Date  . DILATION AND CURETTAGE OF UTERUS     Uterine abalation  . TUBAL LIGATION      Family History  Problem Relation Age of Onset  . Cancer Father     lung cancer  . Hypertension Father   . Hypertension Mother   . Arthritis Mother   . Cancer Maternal Grandmother   . COPD Paternal Aunt    Family Status  Relation Status  . Father Deceased at age 31   Lung cancer  . Mother Alive  . Brother Alive  . Son Alive  . Maternal Grandmother   . Paternal Aunt     Social History   Social History  . Marital status: Married    Spouse name: N/A  . Number of children: 1  . Years of education: BS   Occupational History  .  [place]  full-time   Social History Main Topics  . Smoking status: Never Smoker  . Smokeless tobacco: Never Used  . Alcohol use 3.0 oz/week    6 Standard drinks or equivalent per week     Comment: less than 5 times per week ( beer, wine and hard liquor)  . Drug use: No  . Sexual activity: Not Currently   Other Topics Concern  . Not on file   Social History Narrative   Patient is married with one child.   Patient is right handed.   Patient has a BS degree.   Patient drinks 1 8 oz drink daily.            Diet: depends daily   Do you drink/eat things with caffeine? Yes   Marital status: Married                             What year were you married? 2012   Do you live in a house, apartment, assisted living, condo, trailer, etc)?  House   Is it one or more stories? 2 stories   How many persons live in your home? 3   Do you have  any pets in your home?No pets   Current or past profession: Hotel manager   Do you exercise?     sometimes                                                Type & how often: Irregular   Do you have a living will? No   Do you have a DNR Form? No   Do you have a POA/HPOA forms? No    Allergies  Allergen Reactions  . Penicillin G Potassium In D5w   . Sulfa Antibiotics     Allergies as of 10/20/2016      Reactions   Penicillin G Potassium In D5w    Sulfa Antibiotics       Medication List       Accurate as of 10/20/16 10:06 AM. Always use your most recent med list.          cetirizine 10 MG tablet Commonly known as:  ZYRTEC Take 10 mg by mouth daily.   fluticasone 50 MCG/ACT nasal spray Commonly known as:  FLONASE SHAKE LIQUID AND USE 1 SPRAY IN EACH NOSTRIL DAILY   ibuprofen 800 MG tablet Commonly known as:  ADVIL,MOTRIN Take 800 mg by mouth as needed.   montelukast 10 MG tablet Commonly known as:  SINGULAIR Take 10 mg by mouth at bedtime.   olmesartan-hydrochlorothiazide 40-25 MG tablet Commonly known as:  BENICAR HCT TAKE ONE TABLET BY MOUTH ONCE DAILY   Phentermine-Topiramate 15-92 MG Cp24 1 cap po daily   zolpidem 5 MG tablet Commonly known as:  AMBIEN Take 1 tablet (5 mg total) by mouth at bedtime as needed for sleep.        Review of Systems:  Review of Systems  All other systems reviewed and are negative.   Physical Exam: Vitals:   10/20/16 0959  BP: 112/78  Pulse: 78  Temp: 97.9 F (36.6 C)  TempSrc: Oral  SpO2: 98%  Weight: 159 lb 3.2 oz (72.2 kg)  Height: 5' 4"  (1.626 m)   Body mass index is 27.33 kg/m. Physical Exam  Constitutional: She is  oriented to person, place, and time. She appears well-developed and well-nourished. No distress.  HENT:  Head: Normocephalic and atraumatic.  Right Ear: Hearing, tympanic membrane, external ear and ear canal normal.  Left Ear: Hearing, external ear and ear canal normal.  Mouth/Throat: Uvula is  midline, oropharynx is clear and moist and mucous membranes are normal. She does not have dentures. No oropharyngeal exudate.  Right cerumen impaction  Eyes: Conjunctivae, EOM and lids are normal. Pupils are equal, round, and reactive to light. No scleral icterus.  Neck: Trachea normal and normal range of motion. Neck supple. Carotid bruit is not present. No tracheal deviation present. No thyroid mass and no thyromegaly present.  Cardiovascular: Normal rate, regular rhythm, normal heart sounds and intact distal pulses.  Exam reveals no gallop and no friction rub.   No murmur heard. No LE edema b/l. no calf TTP.   Pulmonary/Chest: Effort normal and breath sounds normal. No stridor. No respiratory distress. She has no wheezes. She has no rhonchi. She has no rales. Right breast exhibits no inverted nipple, no mass, no nipple discharge, no skin change and no tenderness. Left breast exhibits no inverted nipple, no mass, no nipple discharge, no skin change and no tenderness. Breasts are symmetrical.  Abdominal: Soft. Normal appearance, normal aorta and bowel sounds are normal. She exhibits no distension, no pulsatile midline mass and no mass. There is no hepatosplenomegaly or hepatomegaly. There is no tenderness. There is no rigidity, no rebound and no guarding. No hernia.  Musculoskeletal: Normal range of motion.  Lymphadenopathy:       Head (right side): No posterior auricular adenopathy present.       Head (left side): No posterior auricular adenopathy present.    She has no cervical adenopathy.       Right: No supraclavicular adenopathy present.       Left: No supraclavicular adenopathy present.  Neurological: She is alert and oriented to person, place, and time. She has normal strength and normal reflexes. No cranial nerve deficit. Gait normal.  Skin: Skin is warm, dry and intact. No rash noted. Nails show no clubbing.  Psychiatric: She has a normal mood and affect. Her speech is normal and behavior  is normal. Judgment and thought content normal. Cognition and memory are normal.    Labs reviewed:  Basic Metabolic Panel:  Recent Labs  10/24/15 0956 07/07/16 0916  NA 138 140  K 3.8 3.7  CL 97 107  CO2 24 25  GLUCOSE 94 72  BUN 17 19  CREATININE 0.60 0.68  CALCIUM 10.3* 9.9   Liver Function Tests:  Recent Labs  10/24/15 0956 02/27/16 1010 07/07/16 0916  AST 29 22  --   ALT 41* 24 29  ALKPHOS 71 69  --   BILITOT 0.8 0.7  --   PROT 7.7 8.4  --   ALBUMIN 4.7 4.9  --    No results for input(s): LIPASE, AMYLASE in the last 8760 hours. No results for input(s): AMMONIA in the last 8760 hours. CBC: No results for input(s): WBC, NEUTROABS, HGB, HCT, MCV, PLT in the last 8760 hours. Lipid Panel: No results for input(s): CHOL, HDL, LDLCALC, TRIG, CHOLHDL, LDLDIRECT in the last 8760 hours. Lab Results  Component Value Date   HGBA1C 5.3 06/19/2014    Procedures: No results found.  Assessment/Plan   ICD-9-CM ICD-10-CM   1. Well adult exam V70.0 Z00.00 CMP with eGFR     Lipid Panel     TSH  2. Overweight (  BMI 25.0-29.9) 278.02 E66.3   3. Benign essential HTN 401.1 I10 CBC with Differential/Platelets  4. Chronic allergic rhinitis due to pollen, unspecified seasonality 477.0 J30.1   5. Impacted cerumen of right ear 380.4 H61.21      Pt is UTD on health maintenance. Vaccinations are UTD. Pt maintains a healthy lifestyle. Encouraged pt to exercise 30-45 minutes 4-5 times per week. Eat a well balanced diet. Avoid smoking. Limit alcohol intake. Wear seatbelt when riding in the car. Wear sun block (SPF >50) when spending extended times outside.  Continue current medications as ordered  Follow up with GYN as scheduled  Will call with lab results  Follow up with ENT for wax removal  Follow up in 3 mos for routine visit  Refael Fulop S. Perlie Gold  [facility] [address] [place], [place] [address] [phone] Cell  (Monday-Friday 8 AM - 5 PM) [phone] After 5 PM and follow prompts

## 2016-10-20 NOTE — Patient Instructions (Addendum)
Encouraged her to exercise 30-45 minutes 4-5 times per week. Eat a well balanced diet. Avoid smoking. Limit alcohol intake. Wear seatbelt when riding in the car. Wear sun block (SPF >50) when spending extended times outside.  Continue current medications as ordered  Follow up with GYN as scheduled  Will call with lab results  Follow up with ENT for wax removal  Follow up in 3 mos for routine visit

## 2016-12-07 LAB — HM MAMMOGRAPHY

## 2016-12-27 ENCOUNTER — Ambulatory Visit (INDEPENDENT_AMBULATORY_CARE_PROVIDER_SITE_OTHER): Payer: Managed Care, Other (non HMO) | Admitting: Neurology

## 2016-12-27 ENCOUNTER — Encounter: Payer: Self-pay | Admitting: Neurology

## 2016-12-27 VITALS — BP 100/71 | HR 78 | Resp 20 | Ht 63.0 in | Wt 159.0 lb

## 2016-12-27 DIAGNOSIS — E538 Deficiency of other specified B group vitamins: Secondary | ICD-10-CM | POA: Diagnosis not present

## 2016-12-27 DIAGNOSIS — R413 Other amnesia: Secondary | ICD-10-CM

## 2016-12-27 DIAGNOSIS — R4189 Other symptoms and signs involving cognitive functions and awareness: Secondary | ICD-10-CM

## 2016-12-27 NOTE — Progress Notes (Signed)
[facility]    Provider:  Dr Jaynee Eagles Referring Provider: Gildardo Cranker, DO Primary Care Physician:  Gildardo Cranker, DO  CC:  Memory loss  HPI:  Anita Brandt is a 51 y.o. female here as a new referral from Dr. Eulas Post for a new problem memory loss. Past medical history of hypertension, under a lot of stress. She was seen several years ago for left-sided paresthesias and MRi brain was normal, had degenerative changes in the neck and lumbar spine and had epidural injections the symptoms have resolved. She felt better when she was on a diet and lost 10 pounds. Paternal grandmother had dementia. She is under a lot of stress with her husband's cancer. She is menopausal. Her memory isn't "there". She really started noticing it in December after her husband finished radiation therapy and was trying to regain normalcy. This past weekend she couldn't remember the name of the comedian they saw that Friday in TV but she did fall asleep. She takes Ambien at night to sleep. Husband has a feeding tube and a feeding pump and she lods it and so does not sleep well. Possibly getting slightly worse. She couldn't remember the 3 words on the MMSE. She has to write everything down, if it doesn't make it on the list it will not get done. She is back to work recently as well just came off of FMLA. She does not get lost driving. She does forget names but not of people who are close to her, she hadn't seen a friend for a year and forgot her name in the grocery store, bills are paid, she cares for her husband. 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 No other focal neurologic deficits, associated symptoms, inciting events or modifiable factors.   Reviewed notes, labs and imaging from outside physicians, which showed: Reviewed primary care notes. Patient is concerned about short-term memory loss and forgetfulness.She is menopausal without an elevated Almond 2016. Her grandmother has Alzheimer's. She has become  much more forgetful lately.She has also been under a lot of stress. Her husband had tonsillar cancer. He has continuing issues with infusion feeding tube to deal with. She has a 45 year old daughter. She has insomnia and uses Ambien at night. Reviewed physical exam which was normal including blood pressure, head, ears, eyes, nose, throat, lungs, heart, abdomen.  Hemoglobin 10.7 mildly anemic in the past but last CBC was normal with hemoglobin 13, TSH 0.94, vitamin D 34  She was seen in the past in August 2015 for  left-sided paresthesias and MRI of the brain, lumbar and cervical spine and EMG nerve conduction studies were ordered. MRI of the brain was normal. Cervical and lumbar showed degenrative disease but normal cord and cauda equina.   personally reviewed images mri brain 06/2014 which was normal  Review of Systems: Patient complains of symptoms per HPI as well as the following symptoms: Memory loss, no shortness of breath, no chest pain. Pertinent negatives per HPI. All others negative.   Social History   Social History  . Marital status: Married    Spouse name: N/A  . Number of children: 1  . Years of education: BS   Occupational History  . 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 Smoking status: Never Smoker  . Smokeless tobacco: Never Used  . Alcohol use 3.0 oz/week    6 Standard drinks or equivalent per week     Comment: less than 5  times per week ( beer, wine and hard liquor)  . Drug use: No  . Sexual activity: Not Currently   Other Topics Concern  . Not on file   Social History Narrative   Patient is married with one child.   Patient is right handed.   Patient has a BS degree.   Patient drinks 1 8 oz drink daily.            Diet: depends daily   Do you drink/eat things with caffeine? Yes   Marital status: Married                             What year were you married? 2012   Do you live in a house, apartment, assisted living,  condo, trailer, etc)?  House   Is it one or more stories? 2 stories   How many persons live in your home? 3   Do you have any pets in your home?No pets   Current or past profession: Hotel manager   Do you exercise?     sometimes                                                Type & how often: Irregular   Do you have a living will? No   Do you have a DNR Form? No   Do you have a POA/HPOA forms? No    Family History  Problem Relation Age of Onset  . Cancer Father     lung cancer  . Hypertension Father   . Hypertension Mother   . Arthritis Mother   . Cancer Maternal Grandmother   . COPD Paternal 20   . Alzheimer's disease Paternal Grandmother     Past Medical History:  Diagnosis Date  . High blood pressure   . Leg pain   . Numbness   . Perimenopause     Past Surgical History:  Procedure Laterality Date  . DILATION AND CURETTAGE OF UTERUS     Uterine abalation  . TUBAL LIGATION      Current Outpatient Prescriptions  Medication Sig Dispense Refill  . cetirizine (ZYRTEC) 10 MG tablet Take 10 mg by mouth daily.    . fluticasone (FLONASE) 50 MCG/ACT nasal spray SHAKE LIQUID AND USE 1 SPRAY IN EACH NOSTRIL DAILY 16 g 3  . ibuprofen (ADVIL,MOTRIN) 800 MG tablet Take 800 mg by mouth as needed.    . montelukast (SINGULAIR) 10 MG tablet Take 10 mg by mouth at bedtime.    Marland Kitchen olmesartan-hydrochlorothiazide (BENICAR HCT) 40-25 MG tablet TAKE ONE TABLET BY MOUTH ONCE DAILY 30 tablet 3  . Phentermine-Topiramate 15-92 MG CP24 1 cap po daily 30 capsule 3  . zolpidem (AMBIEN) 5 MG tablet Take 1 tablet (5 mg total) by mouth at bedtime as needed for sleep. 30 tablet 1   No current facility-administered medications for this visit.     Allergies as of 12/27/2016 - Review Complete 12/27/2016  Allergen Reaction Noted  . Penicillin g potassium in d5w  06/18/2014  . Sulfa antibiotics  06/18/2014    Vitals: BP 100/71   Pulse 78   Resp 20   Ht 5\' 3"  (1.6 m)   Wt 159 lb (72.1 kg)   BMI  28.17 kg/m  Last Weight:  Wt Readings from Last  1 Encounters:  12/27/16 159 lb (72.1 kg)   Last Height:   Ht Readings from Last 1 Encounters:  12/27/16 5\' 3"  (1.6 m)    Physical exam: Exam: Gen: NAD, conversant, well nourised, obese, well groomed                     CV: RRR, no MRG. No Carotid Bruits. No peripheral edema, warm, nontender Eyes: Conjunctivae clear without exudates or hemorrhage  Neuro: Detailed Neurologic Exam  Speech:    Speech is normal; fluent and spontaneous with normal comprehension.  Cognition:  MMSE - Mini Mental State Exam 12/27/2016  Orientation to time 5  Orientation to Place 5  Registration 3  Attention/ Calculation 5  Recall 2  Language- name 2 objects 2  Language- repeat 1  Language- follow 3 step command 2  Language- read & follow direction 1  Write a sentence 1  Copy design 1  Total score 28      The patient is oriented to person, place, and time;     recent and remote memory intact;     language fluent;     normal attention, concentration,     fund of knowledge Cranial Nerves:    The pupils are equal, round, and reactive to light. The fundi are normal and spontaneous venous pulsations are present. Visual fields are full to finger confrontation. Extraocular movements are intact. Trigeminal sensation is intact and the muscles of mastication are normal. The face is symmetric. The palate elevates in the midline. Hearing intact. Voice is normal. Shoulder shrug is normal. The tongue has normal motion without fasciculations.   Coordination:    Normal finger to nose and heel to shin. Normal rapid alternating movements.   Gait:    Heel-toe and tandem gait are normal.   Motor Observation:    No asymmetry, no atrophy, and no involuntary movements noted. Tone:    Normal muscle tone.    Posture:    Posture is normal. normal erect    Strength:    Strength is V/V in the upper and lower limbs.      Sensation: intact to LT     Reflex  Exam:  DTR's:    Deep tendon reflexes in the upper and lower extremities are normal bilaterally.   Toes:    The toes are downgoing bilaterally.   Clonus:    Clonus is absent.      Assessment/Plan:   51 y.o. female here as a new referral from Dr. Eulas Post for a new problem memory loss. Past medical history of hypertension, under a lot of stress. Likely multifactorial due to stress, not sleeping well, menopause but highly unlikely any neurodegenerative disorder. But considering family history and slightly reduced MME 28/30 need to repeat MRI of the brain to compare to 2015 for any changes or atrophy.  MRI brain wo contrast to eval for degenerative changes due to memory loss, reversible cause of dementia,  MMSE was 28/30 slightly decreased, FHx dementia Check B12 Follow up if symptoms worsen  Sarina Ill, MD  [facility] [address] [place], [place] [address]  Phone [phone] Fax [phone]

## 2016-12-27 NOTE — Patient Instructions (Signed)
Remember to drink plenty of fluid, eat healthy meals and do not skip any meals. Try to eat protein with a every meal and eat a healthy snack such as fruit or nuts in between meals. Try to keep a regular sleep-wake schedule and try to exercise daily, particularly in the form of walking, 20-30 minutes a day, if you can.   As far as your medications are concerned, I would like to suggest: Continue Vit D  As far as diagnostic testing: MRI brain and lab today  I would like to see you back in 6 months if needed, sooner if we need to.    Our phone number is [phone]. We also have an after hours call service for urgent matters and there is a physician on-call for urgent questions. For any emergencies you know to call 911 or go to the nearest emergency room

## 2016-12-29 LAB — VITAMIN B12: Vitamin B-12: 1303 pg/mL — ABNORMAL HIGH (ref 232–1245)

## 2016-12-29 LAB — METHYLMALONIC ACID, SERUM: METHYLMALONIC ACID: 82 nmol/L (ref 0–378)

## 2016-12-30 ENCOUNTER — Telehealth: Payer: Self-pay | Admitting: *Deleted

## 2016-12-30 NOTE — Telephone Encounter (Signed)
Per Dr Jaynee Eagles, spoke with patient and informed her that her lab results are normal. She stated she was waiting ot hear about MRI. Advised her it is ready to be scheduled. Gave her [facility]'s number to call. She verbalized understanding, appreciation of call.

## 2017-01-09 ENCOUNTER — Ambulatory Visit
Admission: RE | Admit: 2017-01-09 | Discharge: 2017-01-09 | Disposition: A | Discharge status: Home / Self Care | Payer: Self-pay | Source: Ambulatory Visit | Attending: Neurology | Admitting: Neurology

## 2017-01-09 DIAGNOSIS — R413 Other amnesia: Secondary | ICD-10-CM | POA: Diagnosis not present

## 2017-01-12 ENCOUNTER — Telehealth: Payer: Self-pay | Admitting: *Deleted

## 2017-01-12 NOTE — Telephone Encounter (Signed)
LVM with number requesting call back re: MRI results.

## 2017-01-12 NOTE — Telephone Encounter (Signed)
Patient called returning RNs call, call was transferred to RN.

## 2017-01-12 NOTE — Telephone Encounter (Signed)
Received call back from patient; informed her per Dr Jaynee Eagles, that her MRI brain is normal. She verbalized understanding, had no questions.

## 2017-01-25 ENCOUNTER — Other Ambulatory Visit: Payer: Self-pay | Admitting: Internal Medicine

## 2017-01-25 DIAGNOSIS — J301 Allergic rhinitis due to pollen: Secondary | ICD-10-CM

## 2017-01-28 ENCOUNTER — Ambulatory Visit: Payer: Managed Care, Other (non HMO) | Admitting: Internal Medicine

## 2017-01-28 ENCOUNTER — Encounter: Payer: Managed Care, Other (non HMO) | Admitting: Internal Medicine

## 2017-02-05 ENCOUNTER — Other Ambulatory Visit: Payer: Self-pay | Admitting: Internal Medicine

## 2017-03-11 ENCOUNTER — Ambulatory Visit: Payer: Managed Care, Other (non HMO) | Admitting: Internal Medicine

## 2017-03-25 ENCOUNTER — Encounter: Payer: Self-pay | Admitting: Internal Medicine

## 2017-03-25 ENCOUNTER — Encounter: Payer: Self-pay | Admitting: *Deleted

## 2017-03-25 ENCOUNTER — Ambulatory Visit (INDEPENDENT_AMBULATORY_CARE_PROVIDER_SITE_OTHER): Payer: 59 | Admitting: Internal Medicine

## 2017-03-25 VITALS — BP 118/76 | HR 81 | Temp 97.5°F | Ht 63.0 in | Wt 159.8 lb

## 2017-03-25 DIAGNOSIS — J301 Allergic rhinitis due to pollen: Secondary | ICD-10-CM | POA: Diagnosis not present

## 2017-03-25 DIAGNOSIS — E663 Overweight: Secondary | ICD-10-CM | POA: Diagnosis not present

## 2017-03-25 DIAGNOSIS — Z79899 Other long term (current) drug therapy: Secondary | ICD-10-CM

## 2017-03-25 DIAGNOSIS — I10 Essential (primary) hypertension: Secondary | ICD-10-CM | POA: Diagnosis not present

## 2017-03-25 DIAGNOSIS — E559 Vitamin D deficiency, unspecified: Secondary | ICD-10-CM | POA: Diagnosis not present

## 2017-03-25 LAB — BASIC METABOLIC PANEL WITH GFR
BUN: 18 mg/dL (ref 7–25)
CO2: 27 mmol/L (ref 20–31)
Calcium: 10 mg/dL (ref 8.6–10.2)
Chloride: 102 mmol/L (ref 98–110)
Creat: 0.73 mg/dL (ref 0.50–1.10)
GLUCOSE: 87 mg/dL (ref 65–99)
POTASSIUM: 4.3 mmol/L (ref 3.5–5.3)
SODIUM: 139 mmol/L (ref 135–146)

## 2017-03-25 MED ORDER — PHENTERMINE-TOPIRAMATE ER 15-92 MG PO CP24: ORAL_CAPSULE | ORAL | 3 refills | Status: DC

## 2017-03-25 NOTE — Progress Notes (Signed)
Patient ID: Anita Brandt, female   DOB: [birth year], 51 y.o.   MRN: [id number]    Location:  [facility] Place of Service: OFFICE  Chief Complaint  Patient presents with  . Medical Management of Chronic Issues    3 Month Routine Visit    HPI:  51 yo female seen today for f/u. Her spouse continues to heal from bout with throat cancer. He had Peg tube removed a few weeks ago. She reports decreased energy level. She has been out of qsymia x 1 month. Eating more chocolate. Lost down to 152 lbs by March but has gained back to 159 lbs. She purchased a treadmill and plans to use it.  She has seasonal allergy sx's and takes zyrtec, flonase and singulair.  Obesity - She was started on qsymia in Feb 2017. Current BMI 28.4 (prev. 27.33). She completed the Whole 30 diet plan in conjunction with med. She is not maintaining a healthy diet. She snacks. She is not exercising as she should.   She has not needed ibuprofen for left leg pain due to sciatica. Denies LBP but has hx scoliosis. She noticed that after completing the Whole 30 diet most of her pain resolved.  ALT elevated hx -  Last level nml at 29  HTN - BP stable on benicar hct  Hx Vitamin D deficiency - no recent Vit D level. She does take OTC supplement  Past Medical History:  Diagnosis Date  . High blood pressure   . Leg pain   . Numbness   . Perimenopause     Past Surgical History:  Procedure Laterality Date  . DILATION AND CURETTAGE OF UTERUS     Uterine abalation  . TUBAL LIGATION      Patient Care Team: Gildardo Cranker, DO as PCP - General (Internal Medicine)  Social History   Social History  . Marital status: Married    Spouse name: N/A  . Number of children: 1  . Years of education: BS   Occupational History  . 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 Smoking status: Never Smoker  . Smokeless tobacco: Never Used  . Alcohol use 3.0 oz/week    6 Standard drinks or equivalent per week   Comment: less than 5 times per week ( beer, wine and hard liquor)  . Drug use: No  . Sexual activity: Not Currently   Other Topics Concern  . Not on file   Social History Narrative   Patient is married with one child.   Patient is right handed.   Patient has a BS degree.   Patient drinks 1 8 oz drink daily.            Diet: depends daily   Do you drink/eat things with caffeine? Yes   Marital status: Married                             What year were you married? 2012   Do you live in a house, apartment, assisted living, condo, trailer, etc)?  House   Is it one or more stories? 2 stories   How many persons live in your home? 3   Do you have any pets in your home?No pets   Current or past profession: QA Analyst   Do you exercise?     sometimes  Type & how often: Irregular   Do you have a living will? No   Do you have a DNR Form? No   Do you have a POA/HPOA forms? No     reports that she has never smoked. She has never used smokeless tobacco. She reports that she drinks about 3.0 oz of alcohol per week . She reports that she does not use drugs.  Family History  Problem Relation Age of Onset  . Cancer Father        lung cancer  . Hypertension Father   . Hypertension Mother   . Arthritis Mother   . Cancer Maternal Grandmother   . COPD Paternal 55   . Alzheimer's disease Paternal Grandmother    Family Status  Relation Status  . Father Deceased at age 52       Lung cancer  . Mother Alive  . Brother Alive  . Son Alive  . MGM (Not Specified)  . Ethlyn Daniels (Not Specified)  . PGM (Not Specified)     Allergies  Allergen Reactions  . Penicillin G Potassium In D5w   . Sulfa Antibiotics     Medications: Patient's Medications  New Prescriptions   No medications on file  Previous Medications   CETIRIZINE (ZYRTEC) 10 MG TABLET    Take 10 mg by mouth daily.   FLUTICASONE (FLONASE) 50 MCG/ACT NASAL SPRAY    SHAKE LIQUID AND  USE 1 SPRAY IN EACH NOSTRIL DAILY   IBUPROFEN (ADVIL,MOTRIN) 800 MG TABLET    Take 800 mg by mouth as needed.   MONTELUKAST (SINGULAIR) 10 MG TABLET    Take 10 mg by mouth at bedtime.   OLMESARTAN-HYDROCHLOROTHIAZIDE (BENICAR HCT) 40-25 MG TABLET    TAKE ONE TABLET BY MOUTH ONCE DAILY   PHENTERMINE-TOPIRAMATE 15-92 MG CP24    1 cap po daily   ZOLPIDEM (AMBIEN) 5 MG TABLET    Take 1 tablet (5 mg total) by mouth at bedtime as needed for sleep.  Modified Medications   No medications on file  Discontinued Medications   No medications on file    Review of Systems  Constitutional: Positive for fatigue.  Genitourinary:       Menopausal sx's  All other systems reviewed and are negative.   Vitals:   03/25/17 0854  BP: 118/76  Pulse: 81  Temp: 97.5 F (36.4 C)  TempSrc: Oral  SpO2: 97%  Weight: 159 lb 12.8 oz (72.5 kg)  Height: _0  (1.6 m)   Body mass index is 28.31 kg/m.  Physical Exam  Constitutional: She is oriented to person, place, and time. She appears well-developed and well-nourished.  HENT:  Mouth/Throat: Oropharynx is clear and moist. No oropharyngeal exudate.  Eyes: Pupils are equal, round, and reactive to light. No scleral icterus.  Neck: Neck supple. Carotid bruit is not present. No tracheal deviation present. No thyromegaly present.  Cardiovascular: Normal rate, regular rhythm, normal heart sounds and intact distal pulses.  Exam reveals no gallop and no friction rub.   No murmur heard. No LE edema b/l. no calf TTP.   Pulmonary/Chest: Effort normal and breath sounds normal. No stridor. No respiratory distress. She has no wheezes. She has no rales.  Abdominal: Soft. Bowel sounds are normal. She exhibits no distension and no mass. There is no hepatomegaly. There is no tenderness. There is no rebound and no guarding.  Lymphadenopathy:    She has no cervical adenopathy.  Neurological: She is alert and oriented to person, place, and  time.  Skin: Skin is warm and dry. No  rash noted.  Psychiatric: She has a normal mood and affect. Her behavior is normal. Judgment and thought content normal.     Labs reviewed: Office Visit on 12/27/2016  Component Date Value Ref Range Status  . Vitamin B-12 12/27/2016 1303* 232 - 1,245 pg/mL Final  . Methylmalonic Acid 12/27/2016 82  0 - 378 nmol/L Final    No results found.   Assessment/Plan   ICD-9-CM ICD-10-CM   1. Overweight (BMI 25.0-29.9) 278.02 E66.3 Phentermine-Topiramate 15-92 MG CP24  2. Benign essential HTN 401.1 I10 BMP with eGFR  3. Allergic rhinitis due to pollen, unspecified seasonality 477.0 J30.1   4. High risk medication use V58.69 Z79.899 BMP with eGFR  5. Vitamin D deficiency 268.9 E55.9 Vitamin D, 25-hydroxy     Recommend seek Integrative health specialist for fatigue work up (Dr Adriana Simas in [place]; [facility] in [place])  Continue current medications as ordered  Will call with lab results   Follow up in 6 mos for CPE. Fasting labs prior to visit (cbc w diff, cmp, lipid panel, tsh, ua)  Anita Brandt  [facility] [address] [place], [place] [address] [phone] Cell (Monday-Friday 8 AM - 5 PM) [phone] After 5 PM and follow prompts

## 2017-03-25 NOTE — Patient Instructions (Addendum)
Recommend seek Integrative health specialist for fatigue work up (Dr Adriana Simas in [place]; [facility] in [place])  Continue current medications as ordered  Will call with lab results   Follow up in 6 mos for CPE. Fasting labs prior to visit

## 2017-03-26 LAB — VITAMIN D 25 HYDROXY (VIT D DEFICIENCY, FRACTURES): Vit D, 25-Hydroxy: 32 ng/mL (ref 30–100)

## 2017-03-28 ENCOUNTER — Telehealth: Payer: Self-pay

## 2017-03-28 NOTE — Telephone Encounter (Signed)
Medication list has been updated.

## 2017-03-28 NOTE — Telephone Encounter (Signed)
-----   Message from [place], [place] sent at 03/27/2017  3:19 PM EDT ----- Borderline normal vitamin D level - continue OTC Votamin D3 take 2000 units daily; stable electrolytes and kidney function; continue current medications and follow up as scheduled

## 2017-05-30 ENCOUNTER — Other Ambulatory Visit: Payer: Self-pay | Admitting: Internal Medicine

## 2017-05-30 DIAGNOSIS — J301 Allergic rhinitis due to pollen: Secondary | ICD-10-CM

## 2017-05-31 ENCOUNTER — Other Ambulatory Visit: Payer: Self-pay | Admitting: Internal Medicine

## 2017-07-26 ENCOUNTER — Other Ambulatory Visit: Payer: Self-pay | Admitting: Internal Medicine

## 2017-07-26 DIAGNOSIS — M419 Scoliosis, unspecified: Secondary | ICD-10-CM

## 2017-07-26 MED ORDER — IBUPROFEN 800 MG PO TABS: 800.0000 mg | ORAL_TABLET | Freq: Three times a day (TID) | ORAL | 0 refills | Status: DC | PRN

## 2017-08-01 ENCOUNTER — Other Ambulatory Visit: Payer: Self-pay | Admitting: Internal Medicine

## 2017-08-01 DIAGNOSIS — E663 Overweight: Secondary | ICD-10-CM

## 2017-08-21 ENCOUNTER — Other Ambulatory Visit: Payer: Self-pay | Admitting: Internal Medicine

## 2017-08-21 DIAGNOSIS — M419 Scoliosis, unspecified: Secondary | ICD-10-CM

## 2017-09-23 ENCOUNTER — Other Ambulatory Visit: Payer: Self-pay | Admitting: Internal Medicine

## 2017-09-23 DIAGNOSIS — E559 Vitamin D deficiency, unspecified: Secondary | ICD-10-CM

## 2017-09-23 DIAGNOSIS — I10 Essential (primary) hypertension: Secondary | ICD-10-CM

## 2017-09-23 DIAGNOSIS — Z79899 Other long term (current) drug therapy: Secondary | ICD-10-CM

## 2017-09-23 DIAGNOSIS — Z0000 Encounter for general adult medical examination without abnormal findings: Secondary | ICD-10-CM

## 2017-09-29 ENCOUNTER — Other Ambulatory Visit: Payer: 59

## 2017-09-29 DIAGNOSIS — I10 Essential (primary) hypertension: Secondary | ICD-10-CM

## 2017-09-29 DIAGNOSIS — Z79899 Other long term (current) drug therapy: Secondary | ICD-10-CM

## 2017-09-29 DIAGNOSIS — Z0000 Encounter for general adult medical examination without abnormal findings: Secondary | ICD-10-CM

## 2017-09-30 LAB — CBC WITH DIFFERENTIAL/PLATELET
BASOS ABS: 20 {cells}/uL (ref 0–200)
Basophils Relative: 0.4 %
EOS PCT: 0.6 %
Eosinophils Absolute: 30 cells/uL (ref 15–500)
HEMATOCRIT: 36.1 % (ref 35.0–45.0)
Hemoglobin: 12.2 g/dL (ref 11.7–15.5)
LYMPHS ABS: 1870 {cells}/uL (ref 850–3900)
MCH: 32.7 pg (ref 27.0–33.0)
MCHC: 33.8 g/dL (ref 32.0–36.0)
MCV: 96.8 fL (ref 80.0–100.0)
MPV: 10 fL (ref 7.5–12.5)
Monocytes Relative: 8.6 %
NEUTROS PCT: 53 %
Neutro Abs: 2650 cells/uL (ref 1500–7800)
Platelets: 344 10*3/uL (ref 140–400)
RBC: 3.73 10*6/uL — ABNORMAL LOW (ref 3.80–5.10)
RDW: 12 % (ref 11.0–15.0)
Total Lymphocyte: 37.4 %
WBC: 5 10*3/uL (ref 3.8–10.8)
WBCMIX: 430 {cells}/uL (ref 200–950)

## 2017-09-30 LAB — URINALYSIS, ROUTINE W REFLEX MICROSCOPIC
Bilirubin Urine: NEGATIVE
Glucose, UA: NEGATIVE
HGB URINE DIPSTICK: NEGATIVE
KETONES UR: NEGATIVE
Leukocytes, UA: NEGATIVE
NITRITE: NEGATIVE
PROTEIN: NEGATIVE
Specific Gravity, Urine: 1.024 (ref 1.001–1.03)
pH: 5.5 (ref 5.0–8.0)

## 2017-09-30 LAB — COMPLETE METABOLIC PANEL WITH GFR
AG Ratio: 1.4 (calc) (ref 1.0–2.5)
ALBUMIN MSPROF: 4.4 g/dL (ref 3.6–5.1)
ALT: 18 U/L (ref 6–29)
AST: 18 U/L (ref 10–35)
Alkaline phosphatase (APISO): 60 U/L (ref 33–130)
BILIRUBIN TOTAL: 0.8 mg/dL (ref 0.2–1.2)
BUN: 21 mg/dL (ref 7–25)
CHLORIDE: 103 mmol/L (ref 98–110)
CO2: 27 mmol/L (ref 20–32)
Calcium: 10 mg/dL (ref 8.6–10.4)
Creat: 0.71 mg/dL (ref 0.50–1.05)
GFR, EST AFRICAN AMERICAN: 115 mL/min/{1.73_m2} (ref >=60)
GFR, Est Non African American: 99 mL/min/{1.73_m2} (ref >=60)
GLUCOSE: 88 mg/dL (ref 65–99)
Globulin: 3.2 g/dL (calc) (ref 1.9–3.7)
Potassium: 3.8 mmol/L (ref 3.5–5.3)
Sodium: 140 mmol/L (ref 135–146)
TOTAL PROTEIN: 7.6 g/dL (ref 6.1–8.1)

## 2017-09-30 LAB — LIPID PANEL
Cholesterol: 203 mg/dL — ABNORMAL HIGH (ref <200)
HDL: 99 mg/dL (ref >50)
LDL Cholesterol (Calc): 93 mg/dL (calc)
Non-HDL Cholesterol (Calc): 104 mg/dL (calc) (ref <130)
Total CHOL/HDL Ratio: 2.1 (calc) (ref <5.0)
Triglycerides: 37 mg/dL (ref <150)

## 2017-09-30 LAB — TSH: TSH: 0.85 m[IU]/L

## 2017-10-05 ENCOUNTER — Ambulatory Visit (INDEPENDENT_AMBULATORY_CARE_PROVIDER_SITE_OTHER): Payer: 59 | Admitting: Internal Medicine

## 2017-10-05 ENCOUNTER — Encounter: Payer: Self-pay | Admitting: Internal Medicine

## 2017-10-05 VITALS — BP 110/78 | HR 96 | Temp 97.9°F | Ht 63.0 in | Wt 160.2 lb

## 2017-10-05 DIAGNOSIS — Z79899 Other long term (current) drug therapy: Secondary | ICD-10-CM | POA: Diagnosis not present

## 2017-10-05 DIAGNOSIS — J301 Allergic rhinitis due to pollen: Secondary | ICD-10-CM

## 2017-10-05 DIAGNOSIS — E663 Overweight: Secondary | ICD-10-CM

## 2017-10-05 DIAGNOSIS — Z1211 Encounter for screening for malignant neoplasm of colon: Secondary | ICD-10-CM

## 2017-10-05 DIAGNOSIS — Z0000 Encounter for general adult medical examination without abnormal findings: Secondary | ICD-10-CM

## 2017-10-05 DIAGNOSIS — J019 Acute sinusitis, unspecified: Secondary | ICD-10-CM | POA: Diagnosis not present

## 2017-10-05 DIAGNOSIS — I10 Essential (primary) hypertension: Secondary | ICD-10-CM | POA: Diagnosis not present

## 2017-10-05 MED ORDER — PHENTERMINE-TOPIRAMATE ER 15-92 MG PO CP24: 1.0000 | ORAL_CAPSULE | Freq: Every day | ORAL | 2 refills | Status: DC

## 2017-10-05 MED ORDER — TETANUS-DIPHTH-ACELL PERTUSSIS 5-2.5-18.5 LF-MCG/0.5 IM SUSP: 0.5000 mL | Freq: Once | INTRAMUSCULAR | 0 refills | Status: AC

## 2017-10-05 MED ORDER — CEFUROXIME AXETIL 250 MG PO TABS: 250.0000 mg | ORAL_TABLET | Freq: Two times a day (BID) | ORAL | 0 refills | Status: DC

## 2017-10-05 NOTE — Progress Notes (Signed)
Patient ID: Anita Brandt, female   DOB: 1966/03/16, 51 y.o.   MRN: [id number]   Location:  [facility]  Place of Service:  OFFICE  Provider: Arletha Grippe, DO  Patient Care Team: Gildardo Cranker, DO as PCP - General (Internal Medicine)  Extended Emergency Contact Information Primary Emergency Contact: [facility] Address: [address]          [place], [place] [address] Johnnette Litter of [place] Phone: [phone] Mobile Phone: [phone] Relation: Spouse  Code Status: FULL CODE Goals of Care: Advanced Directive information Advanced Directives 03/25/2017  Does Patient Have a Medical Advance Directive? No  Would patient like information on creating a medical advance directive? No - Patient declined     Chief Complaint  Patient presents with  . Annual Exam    Yearly Exam  . Colonoscopy    discuss cologuard  . Immunizations    TDAP, Refused FLU    HPI: Patient is a 51 y.o. female seen in today for CPE.    She has seasonal allergy sx's and takes zyrtec, flonase and singulair. She has frequent flares and uses nasal spray daily now. She reports persistent cold since October. No new stressors. She is employed as a Psychologist, prison and probation services for [employer] and is happy with work.  Obesity - She was started on qsymia in Feb 2017 and has been out of med for several weeks. Current BMI 28.39 (prev. 28.4). She completed the Whole 30 diet plan in conjunction with med. She is not maintaining a healthy diet. She snacks. She is not exercising as she should.   Hx elevated ALT  - remains nml  HTN - controlled on benicar hct  Hx Vitamin D deficiency - stable. Vit D 25OH level 32. Takes OTC supplement  Hx scoliosis - stable. No worsening back pain or sciatica   Depression screen [facility] 2/9 10/05/2017 10/20/2016 10/24/2015  Decreased Interest 0 0 0  Down, Depressed, Hopeless 0 0 0  PHQ - 2 Score 0 0 0    Fall Risk  10/05/2017 03/25/2017 10/20/2016 10/07/2016 07/07/2016  Falls in the past  year? No No No No No   MMSE - Mini Mental State Exam 12/27/2016  Orientation to time 5  Orientation to Place 5  Registration 3  Attention/ Calculation 5  Recall 2  Language- name 2 objects 2  Language- repeat 1  Language- follow 3 step command 2  Language- read & follow direction 1  Write a sentence 1  Copy design 1  Total score 28     Health Maintenance  Topic Date Due  . TETANUS/TDAP  06/12/1986  . COLONOSCOPY  06/12/2017  . INFLUENZA VACCINE  06/07/2018 (Originally 05/25/2017)  . PAP SMEAR  10/31/2017  . MAMMOGRAM  12/07/2017  . HIV Screening  Completed    Past Medical History:  Diagnosis Date  . High blood pressure   . Leg pain   . Numbness   . Perimenopause     Past Surgical History:  Procedure Laterality Date  . DILATION AND CURETTAGE OF UTERUS     Uterine abalation  . TUBAL LIGATION      Family History  Problem Relation Age of Onset  . Cancer Father        lung cancer  . Hypertension Father   . Hypertension Mother   . Arthritis Mother   . Cancer Maternal Grandmother   . COPD Paternal 9   . Alzheimer's disease Paternal Grandmother    Family Status  Relation  Name Status  . Father Juanda Crumble Deceased at age 27       Lung cancer  . Mother Tobi Bastos  . Brother Ellin Saba. Alive  . Son [place] Alive  . MGM  (Not Specified)  . Ethlyn Daniels  (Not Specified)  . PGM  (Not Specified)    Social History   Socioeconomic History  . Marital status: Married    Spouse name: Not on file  . Number of children: 1  . Years of education: BS  . Highest education level: Not on file  Social Needs  . Financial resource strain: Not on file  . Food insecurity - worry: Not on file  . Food insecurity - inability: Not on file  . Transportation needs - medical: Not on file  . Transportation needs - non-medical: Not on file  Occupational History  . Occupation: Theme park manager: Counsellor    Comment: full-time  Tobacco Use  . Smoking status: Never Smoker    . Smokeless tobacco: Never Used  Substance and Sexual Activity  . Alcohol use: Yes    Alcohol/week: 3.0 oz    Types: 6 Standard drinks or equivalent per week    Comment: less than 5 times per week ( beer, wine and hard liquor)  . Drug use: No  . Sexual activity: Not Currently  Other Topics Concern  . Not on file  Social History Narrative   Patient is married with one child.   Patient is right handed.   Patient has a BS degree.   Patient drinks 1 8 oz drink daily.            Diet: depends daily   Do you drink/eat things with caffeine? Yes   Marital status: Married                             What year were you married? 2012   Do you live in a house, apartment, assisted living, condo, trailer, etc)?  House   Is it one or more stories? 2 stories   How many persons live in your home? 3   Do you have any pets in your home?No pets   Current or past profession: Hotel manager   Do you exercise?     sometimes                                                Type & how often: Irregular   Do you have a living will? No   Do you have a DNR Form? No   Do you have a POA/HPOA forms? No    Allergies  Allergen Reactions  . Penicillin G Potassium In D5w   . Sulfa Antibiotics     Allergies as of 10/05/2017      Reactions   Penicillin G Potassium In D5w    Sulfa Antibiotics       Medication List        Accurate as of 10/05/17 10:14 AM. Always use your most recent med list.          cetirizine 10 MG tablet Commonly known as:  ZYRTEC Take 10 mg by mouth daily.   fluticasone 50 MCG/ACT nasal spray Commonly known as:  FLONASE SHAKE LIQUID AND USE 1 SPRAY IN EACH NOSTRIL DAILY  ibuprofen 800 MG tablet Commonly known as:  ADVIL,MOTRIN Take 1 tablet (800 mg total) by mouth every 8 (eight) hours as needed for mild pain or moderate pain.   ibuprofen 800 MG tablet Commonly known as:  ADVIL,MOTRIN TAKE 1 TABLET(800 MG) BY MOUTH DAILY AS NEEDED FOR MILD PAIN OR MODERATE PAIN    montelukast 10 MG tablet Commonly known as:  SINGULAIR Take 10 mg by mouth at bedtime.   olmesartan-hydrochlorothiazide 40-25 MG tablet Commonly known as:  BENICAR HCT TAKE ONE TABLET BY MOUTH ONCE DAILY   QSYMIA 15-92 MG Cp24 Generic drug:  Phentermine-Topiramate TAKE ONE CAPSULE BY MOUTH EVERY DAY   Tdap 5-2.5-18.5 LF-MCG/0.5 injection Commonly known as:  BOOSTRIX Inject 0.5 mLs into the muscle once.   Vitamin D3 2000 units Tabs Take 2,000 Units by mouth daily.   zolpidem 5 MG tablet Commonly known as:  AMBIEN Take 1 tablet (5 mg total) by mouth at bedtime as needed for sleep.        Review of Systems:  Review of Systems  HENT: Positive for congestion, postnasal drip and voice change.   Respiratory: Positive for cough.   Neurological: Positive for headaches.  All other systems reviewed and are negative.   Physical Exam: Vitals:   10/05/17 0959  BP: 110/78  Pulse: 96  Temp: 97.9 F (36.6 C)  TempSrc: Oral  SpO2: 99%  Weight: 160 lb 3.2 oz (72.7 kg)  Height: 5\' 3"  (1.6 m)   Body mass index is 28.38 kg/m. Physical Exam  Constitutional: She is oriented to person, place, and time. She appears well-developed and well-nourished. No distress.  HENT:  Head: Normocephalic and atraumatic.  Right Ear: External ear normal.  Left Ear: External ear normal.  Mouth/Throat: Oropharynx is clear and moist. No oropharyngeal exudate.  B/l cerumen impaction; frontal sinus boggy tissue texture changes but no sinus TTP; no maxillary sinus TTP. Nares bloody R>L with enlarged grey turbinates. Oropharynx with cobblestoning but no redness.  Eyes: EOM are normal. Pupils are equal, round, and reactive to light. No scleral icterus.  Neck: Normal range of motion. Neck supple. Carotid bruit is not present. No tracheal deviation present. No thyromegaly present.  Cardiovascular: Normal rate, regular rhythm and intact distal pulses. Exam reveals no gallop and no friction rub.  No murmur  heard. No LE edema b/l. No calf TTP  Pulmonary/Chest: Effort normal and breath sounds normal. No respiratory distress. She has no wheezes. She has no rales. She exhibits no tenderness. Right breast exhibits no inverted nipple, no mass, no nipple discharge, no skin change and no tenderness. Left breast exhibits no inverted nipple, no mass, no nipple discharge, no skin change and no tenderness. Breasts are symmetrical.  No rhonchi  Abdominal: Soft. Bowel sounds are normal. She exhibits no distension and no mass. There is no hepatosplenomegaly or hepatomegaly. There is no tenderness. There is no rebound and no guarding. No hernia.  Genitourinary:  Genitourinary Comments: Deferred to GYN  Musculoskeletal: She exhibits no deformity.  Lymphadenopathy:    She has no cervical adenopathy.  Neurological: She is alert and oriented to person, place, and time. She has normal reflexes.  Skin: Skin is warm and dry. No rash noted.  Psychiatric: She has a normal mood and affect. Her behavior is normal. Judgment normal.  Vitals reviewed.   Labs reviewed:  Basic Metabolic Panel: Recent Labs    10/20/16 1043 03/25/17 0955 09/29/17 0849  NA 141 139 140  K 4.1 4.3 3.8  CL 104  102 103  CO2 21 27 27   GLUCOSE 84 87 88  BUN 18 18 21   CREATININE 0.65 0.73 0.71  CALCIUM 9.9 10.0 10.0  TSH 0.67  --  0.85   Liver Function Tests: Recent Labs    10/20/16 1043 09/29/17 0849  AST 24 18  ALT 25 18  ALKPHOS 74  --   BILITOT 0.5 0.8  PROT 7.8 7.6  ALBUMIN 4.4  --    No results for input(s): LIPASE, AMYLASE in the last 8760 hours. No results for input(s): AMMONIA in the last 8760 hours. CBC: Recent Labs    10/20/16 1043 09/29/17 0849  WBC 5.4 5.0  NEUTROABS 2,754 2,650  HGB 11.8 12.2  HCT 36.1 36.1  MCV 98.1 96.8  PLT 348 344   Lipid Panel: Recent Labs    10/20/16 1043 09/29/17 0849  CHOL 203* 203*  HDL 98 99  LDLCALC 94  --   TRIG 55 37  CHOLHDL 2.1 2.1   Lab Results  Component  Value Date   HGBA1C 5.3 06/19/2014    Procedures: No results found. ECG OBTAINED AND REVIEWED BY MYSELF: NSR @ 90 bpm, LAD, incomplete RBBB, NSST changes, poor R wave progresion. No acute ischemic changes. No other ECG available to compare  Assessment/Plan   ICD-10-CM   1. Well adult exam Z00.00   2. Acute sinusitis, recurrence not specified, unspecified location J01.90 cefUROXime (CEFTIN) 250 MG tablet  3. Overweight (BMI 25.0-29.9) E66.3 Phentermine-Topiramate (QSYMIA) 15-92 MG CP24    DISCONTINUED: Phentermine-Topiramate (QSYMIA) 15-92 MG CP24  4. Benign essential HTN I10 EKG 12-Lead  5. Allergic rhinitis due to pollen, unspecified seasonality J30.1   6. High risk medication use Z79.899   7. Colon cancer screening Z12.11 Ambulatory referral to Gastroenterology   She declined influenza vaccine but plans to obtain tDAP  START CEFTIN 250MG  2 TIMES DAILY X 10 DAYS  TAKE PROBIOTIC ([place]) DAILY WHILE ON ANTIBIOTIC  Continue other medications as ordered - take singulair daily at bedtime  Follow up with ENT for wax removal  Continue diet and exercise routine  Will call with GI referral for colonoscopy  Follow up in 6 mos for HTN, overweight   Nasri Boakye S. Perlie Gold  [facility] [address] [place], [place] [address] [phone] Cell (Monday-Friday 8 AM - 5 PM) [phone] After 5 PM and follow prompts

## 2017-10-05 NOTE — Patient Instructions (Signed)
START CEFTIN 250MG  2 TIMES DAILY X 10 DAYS  TAKE PROBIOTIC (FLORASTER OR CULTURELLE OR ACTIVIA) DAILY WHILE ON ANTIBIOTIC  Continue other medications as ordered - take singulair daily at bedtime  Follow up with ENT for wax removal  Continue diet and exercise routine  Will call with GI referral for colonoscopy  Follow up in 6 mos for HTN, overweight  Keeping You Healthy  Get These Tests  Blood Pressure- Have your blood pressure checked by your healthcare provider at least once a year.  Normal blood pressure is 120/80.  Weight- Have your body mass index (BMI) calculated to screen for obesity.  BMI is a measure of body fat based on height and weight.  You can calculate your own BMI at [employer].it  Cholesterol- Have your cholesterol checked every year.  Diabetes- Have your blood sugar checked every year if you have high blood pressure, high cholesterol, a family history of diabetes or if you are overweight.  Pap Test - Have a pap test every 1 to 5 years if you have been sexually active.  If you are older than 65 and recent pap tests have been normal you may not need additional pap tests.  In addition, if you have had a hysterectomy  for benign disease additional pap tests are not necessary.  Mammogram-Yearly mammograms are essential for early detection of breast cancer  Screening for Colon Cancer- Colonoscopy starting at age 60. Screening may begin sooner depending on your family history and other health conditions.  Follow up colonoscopy as directed by your Gastroenterologist.  Screening for Osteoporosis- Screening begins at age 18 with bone density scanning, sooner if you are at higher risk for developing Osteoporosis.  Get these medicines  Calcium with Vitamin D- Your body requires 1200-1500 mg of Calcium a day and [phone] IU of Vitamin D a day.  You can only absorb 500 mg of Calcium at a time therefore Calcium must be taken in 2 or 3 separate doses throughout the  day.  Hormones- Hormone therapy has been associated with increased risk for certain cancers and heart disease.  Talk to your healthcare provider about if you need relief from menopausal symptoms.  Aspirin- Ask your healthcare provider about taking Aspirin to prevent Heart Disease and Stroke.  Get these Immuniztions  Flu shot- Every fall  Pneumonia shot- Once after the age of 66; if you are younger ask your healthcare provider if you need a pneumonia shot.  Tetanus- Every ten years.  Zostavax- Once after the age of 9 to prevent shingles.  Take these steps  Don't smoke- Your healthcare provider can help you quit. For tips on how to quit, ask your healthcare provider or go to www.smokefree.gov or call 1-800 QUIT-NOW.  Be physically active- Exercise 5 days a week for a minimum of 30 minutes.  If you are not already physically active, start slow and gradually work up to 30 minutes of moderate physical activity.  Try walking, dancing, bike riding, swimming, etc.  Eat a healthy diet- Eat a variety of healthy foods such as fruits, vegetables, whole grains, low fat milk, low fat cheeses, yogurt, lean meats, chicken, fish, eggs, dried beans, tofu, etc.  For more information go to www.thenutritionsource.org  Dental visit- Brush and floss teeth twice daily; visit your dentist twice a year.  Eye exam- Visit your Optometrist or Ophthalmologist yearly.  Drink alcohol in moderation- Limit alcohol intake to one drink or less a day.  Never drink and drive.  Depression- Your emotional  health is as important as your physical health.  If you're feeling down or losing interest in things you normally enjoy, please talk to your healthcare provider.  Seat Belts- can save your life; always wear one  Smoke/Carbon Monoxide detectors- These detectors need to be installed on the appropriate level of your home.  Replace batteries at least once a year.  Violence- If anyone is threatening or hurting you, please  tell your healthcare provider.  Living Will/ Health care power of attorney- Discuss with your healthcare provider and family.

## 2017-10-12 ENCOUNTER — Telehealth: Payer: Self-pay

## 2017-10-12 NOTE — Telephone Encounter (Signed)
m °

## 2017-11-07 ENCOUNTER — Encounter: Payer: Self-pay | Admitting: Nurse Practitioner

## 2017-11-07 ENCOUNTER — Ambulatory Visit (INDEPENDENT_AMBULATORY_CARE_PROVIDER_SITE_OTHER): Payer: 59 | Admitting: Nurse Practitioner

## 2017-11-07 VITALS — BP 116/72 | HR 89 | Temp 98.2°F | Ht 63.0 in | Wt 162.0 lb

## 2017-11-07 DIAGNOSIS — J40 Bronchitis, not specified as acute or chronic: Secondary | ICD-10-CM

## 2017-11-07 NOTE — Patient Instructions (Signed)
To use mucinex DM 1 tablet twice daily for 7 days with full glass of water Increase hydration May use tylenol or ibuprofen as needed for body aches/malaise/sore throat    Acute Bronchitis, Adult Acute bronchitis is when air tubes (bronchi) in the lungs suddenly get swollen. The condition can make it hard to breathe. It can also cause these symptoms:  A cough.  Coughing up clear, yellow, or green mucus.  Wheezing.  Chest congestion.  Shortness of breath.  A fever.  Body aches.  Chills.  A sore throat.  Follow these instructions at home: Medicines  Take over-the-counter and prescription medicines only as told by your doctor.  If you were prescribed an antibiotic medicine, take it as told by your doctor. Do not stop taking the antibiotic even if you start to feel better. General instructions  Rest.  Drink enough fluids to keep your pee (urine) clear or pale yellow.  Avoid smoking and secondhand smoke. If you smoke and you need help quitting, ask your doctor. Quitting will help your lungs heal faster.  Use an inhaler, cool mist vaporizer, or humidifier as told by your doctor.  Keep all follow-up visits as told by your doctor. This is important. How is this prevented? To lower your risk of getting this condition again:  Wash your hands often with soap and water. If you cannot use soap and water, use hand sanitizer.  Avoid contact with people who have cold symptoms.  Try not to touch your hands to your mouth, nose, or eyes.  Make sure to get the flu shot every year.  Contact a doctor if:  Your symptoms do not get better in 2 weeks. Get help right away if:  You cough up blood.  You have chest pain.  You have very bad shortness of breath.  You become dehydrated.  You faint (pass out) or keep feeling like you are going to pass out.  You keep throwing up (vomiting).  You have a very bad headache.  Your fever or chills gets worse. This information is not  intended to replace advice given to you by your health care provider. Make sure you discuss any questions you have with your health care provider. Document Released: 03/29/2008 Document Revised: 05/19/2016 Document Reviewed: 03/31/2016 Elsevier Interactive Patient Education  [employer].

## 2017-11-07 NOTE — Progress Notes (Signed)
Careteam: Patient Care Team: Gildardo Cranker, DO as PCP - General (Internal Medicine)  Advanced Directive information    Allergies  Allergen Reactions  . Penicillin G Potassium In D5w   . Sulfa Antibiotics     Chief Complaint  Patient presents with  . Acute Visit    Pt is being seen due to chest congestion, cough, and ear ache x 1 week.      HPI: Patient is a 52 y.o. female seen in the office today due to chest congestion, cough for 1 week.  Started as ear pain and earache which has resolved.  4 days ago started with cough and congestion and losing voice.  No fevers but has fatigued and malaise.  In her throat and chest.  Chest tightness.  Possible wheezing and snoring which has improved.  Has taken nyquil and dayquil  Warm liquor with lemon that has helped sooth her throat.  Still working and pushing her self. Does not have time to be sick so she is still able to work.     Review of Systems:  Review of Systems  Constitutional: Positive for malaise/fatigue. Negative for chills and fever.  HENT: Negative for congestion, hearing loss, sinus pain and sore throat.   Respiratory: Positive for cough and sputum production. Negative for shortness of breath and wheezing.   Cardiovascular: Negative for chest pain.    Past Medical History:  Diagnosis Date  . High blood pressure   . Leg pain   . Numbness   . Perimenopause    Past Surgical History:  Procedure Laterality Date  . DILATION AND CURETTAGE OF UTERUS     Uterine abalation  . TUBAL LIGATION     Social History:   reports that  has never smoked. she has never used smokeless tobacco. She reports that she drinks about 3.0 oz of alcohol per week. She reports that she does not use drugs.  Family History  Problem Relation Age of Onset  . Cancer Father        lung cancer  . Hypertension Father   . Hypertension Mother   . Arthritis Mother   . Cancer Maternal Grandmother   . COPD Paternal 95   . Alzheimer's  disease Paternal Grandmother     Medications: Patient's Medications  New Prescriptions   No medications on file  Previous Medications   CETIRIZINE (ZYRTEC) 10 MG TABLET    Take 10 mg by mouth daily.   CHOLECALCIFEROL (VITAMIN D3) 2000 UNITS TABS    Take 2,000 Units by mouth daily.   FLUTICASONE (FLONASE) 50 MCG/ACT NASAL SPRAY    SHAKE LIQUID AND USE 1 SPRAY IN EACH NOSTRIL DAILY   IBUPROFEN (ADVIL,MOTRIN) 800 MG TABLET    Take 1 tablet (800 mg total) by mouth every 8 (eight) hours as needed for mild pain or moderate pain.   MONTELUKAST (SINGULAIR) 10 MG TABLET    Take 10 mg by mouth at bedtime.   OLMESARTAN-HYDROCHLOROTHIAZIDE (BENICAR HCT) 40-25 MG TABLET    TAKE ONE TABLET BY MOUTH ONCE DAILY   PHENTERMINE-TOPIRAMATE (QSYMIA) 15-92 MG CP24    Take 1 capsule by mouth daily.   ZOLPIDEM (AMBIEN) 5 MG TABLET    Take 1 tablet (5 mg total) by mouth at bedtime as needed for sleep.  Modified Medications   No medications on file  Discontinued Medications   CEFUROXIME (CEFTIN) 250 MG TABLET    Take 1 tablet (250 mg total) by mouth 2 (two) times daily with a  meal.   IBUPROFEN (ADVIL,MOTRIN) 800 MG TABLET    TAKE 1 TABLET(800 MG) BY MOUTH DAILY AS NEEDED FOR MILD PAIN OR MODERATE PAIN     Physical Exam:  Vitals:   11/07/17 1453  BP: 116/72  Pulse: 89  Temp: 98.2 F (36.8 C)  TempSrc: Oral  SpO2: 96%  Weight: 162 lb (73.5 kg)  Height: 5\' 3"  (1.6 m)   Body mass index is 28.7 kg/m.  Physical Exam  Constitutional: She appears well-developed and well-nourished.  HENT:  Head: Normocephalic and atraumatic.  Right Ear: External ear normal.  Left Ear: External ear normal.  Nose: Mucosal edema and rhinorrhea present.  Mouth/Throat: Oropharynx is clear and moist. No oropharyngeal exudate, posterior oropharyngeal edema, posterior oropharyngeal erythema or tonsillar abscesses.  Loss of voice  Eyes: Conjunctivae and EOM are normal. Pupils are equal, round, and reactive to light.  Neck:  Normal range of motion. Neck supple.  Cardiovascular: Normal rate, regular rhythm and normal heart sounds.  Pulmonary/Chest: Effort normal and breath sounds normal.  Lymphadenopathy:    She has no cervical adenopathy.  Skin: Skin is warm and dry.  Psychiatric: She has a normal mood and affect.   Labs reviewed: Basic Metabolic Panel: Recent Labs    03/25/17 0955 09/29/17 0849  NA 139 140  K 4.3 3.8  CL 102 103  CO2 27 27  GLUCOSE 87 88  BUN 18 21  CREATININE 0.73 0.71  CALCIUM 10.0 10.0  TSH  --  0.85   Liver Function Tests: Recent Labs    09/29/17 0849  AST 18  ALT 18  BILITOT 0.8  PROT 7.6   No results for input(s): LIPASE, AMYLASE in the last 8760 hours. No results for input(s): AMMONIA in the last 8760 hours. CBC: Recent Labs    09/29/17 0849  WBC 5.0  NEUTROABS 2,650  HGB 12.2  HCT 36.1  MCV 96.8  PLT 344   Lipid Panel: Recent Labs    09/29/17 0849  CHOL 203*  HDL 99  TRIG 37  CHOLHDL 2.1   TSH: Recent Labs    09/29/17 0849  TSH 0.85   A1C: Lab Results  Component Value Date   HGBA1C 5.3 06/19/2014     Assessment/Plan 1. Bronchitis -appears viral at this time -mucinex DM by mouth twice daily for 7 days with full glass of water -may use tylenol PRN  -conts to use antihistamine which was previously prescribed -return and follow up precautions discussed  Next appt: as scheduled with Dr Eulas Post, sooner if needed Halley K. Harle Battiest  [facility] [phone] 8 am - 5 pm) [phone] (after hours)

## 2017-11-15 ENCOUNTER — Other Ambulatory Visit: Payer: Self-pay

## 2017-11-15 ENCOUNTER — Ambulatory Visit (AMBULATORY_SURGERY_CENTER): Payer: Self-pay

## 2017-11-15 VITALS — Ht 63.0 in | Wt 160.6 lb

## 2017-11-15 DIAGNOSIS — Z1211 Encounter for screening for malignant neoplasm of colon: Secondary | ICD-10-CM

## 2017-11-15 MED ORDER — NA SULFATE-K SULFATE-MG SULF 17.5-3.13-1.6 GM/177ML PO SOLN: 1.0000 | Freq: Once | ORAL | 0 refills | Status: AC

## 2017-11-15 NOTE — Progress Notes (Signed)
Denies allergies to eggs or soy products. Denies complication of anesthesia or sedation. Denies use of weight loss medication. Denies use of O2.   Emmi instructions declined.   Patient states that she will pay for Suprep if her insurance does not cover the cost.  Patient verbalizes understanding that she should stop taking Phentermine 10 days prior to procedure. Last dose of Phentermine will be 11/18/17. Patient may resume after her procedure or as directed by physician.

## 2017-11-16 ENCOUNTER — Encounter: Payer: Self-pay | Admitting: Internal Medicine

## 2017-11-29 ENCOUNTER — Encounter: Payer: Self-pay | Admitting: Internal Medicine

## 2017-11-29 ENCOUNTER — Ambulatory Visit (AMBULATORY_SURGERY_CENTER): Payer: 59 | Admitting: Internal Medicine

## 2017-11-29 ENCOUNTER — Other Ambulatory Visit: Payer: Self-pay

## 2017-11-29 VITALS — BP 107/57 | HR 72 | Temp 98.2°F | Resp 14 | Ht 63.0 in | Wt 162.0 lb

## 2017-11-29 DIAGNOSIS — D120 Benign neoplasm of cecum: Secondary | ICD-10-CM

## 2017-11-29 DIAGNOSIS — D122 Benign neoplasm of ascending colon: Secondary | ICD-10-CM | POA: Diagnosis not present

## 2017-11-29 DIAGNOSIS — Z1211 Encounter for screening for malignant neoplasm of colon: Secondary | ICD-10-CM

## 2017-11-29 DIAGNOSIS — Z1212 Encounter for screening for malignant neoplasm of rectum: Secondary | ICD-10-CM | POA: Diagnosis not present

## 2017-11-29 MED ORDER — SODIUM CHLORIDE 0.9 % IV SOLN: 500.0000 mL | Freq: Once | INTRAVENOUS | Status: DC

## 2017-11-29 NOTE — Progress Notes (Signed)
No changes in medical or surgical hx since PV per pt  Pt hasn't taken Phentermine since 11-16-17

## 2017-11-29 NOTE — Patient Instructions (Signed)
Discharge instructions given. Handouts on polyps,diverticulosis. Resume previous medications. YOU HAD AN ENDOSCOPIC PROCEDURE TODAY AT THE [facility]:   Refer to the procedure report that was given to you for any specific questions about what was found during the examination.  If the procedure report does not answer your questions, please call your gastroenterologist to clarify.  If you requested that your care partner not be given the details of your procedure findings, then the procedure report has been included in a sealed envelope for you to review at your convenience later.  YOU SHOULD EXPECT: Some feelings of bloating in the abdomen. Passage of more gas than usual.  Walking can help get rid of the air that was put into your GI tract during the procedure and reduce the bloating. If you had a lower endoscopy (such as a colonoscopy or flexible sigmoidoscopy) you may notice spotting of blood in your stool or on the toilet paper. If you underwent a bowel prep for your procedure, you may not have a normal bowel movement for a few days.  Please Note:  You might notice some irritation and congestion in your nose or some drainage.  This is from the oxygen used during your procedure.  There is no need for concern and it should clear up in a day or so.  SYMPTOMS TO REPORT IMMEDIATELY:   Following lower endoscopy (colonoscopy or flexible sigmoidoscopy):  Excessive amounts of blood in the stool  Significant tenderness or worsening of abdominal pains  Swelling of the abdomen that is new, acute  Fever of 100F or higher   For urgent or emergent issues, a gastroenterologist can be reached at any hour by calling [phone].   DIET:  We do recommend a small meal at first, but then you may proceed to your regular diet.  Drink plenty of fluids but you should avoid alcoholic beverages for 24 hours.  ACTIVITY:  You should plan to take it easy for the rest of today and you should NOT DRIVE  or use heavy machinery until tomorrow (because of the sedation medicines used during the test).    FOLLOW UP: Our staff will call the number listed on your records the next business day following your procedure to check on you and address any questions or concerns that you may have regarding the information given to you following your procedure. If we do not reach you, we will leave a message.  However, if you are feeling well and you are not experiencing any problems, there is no need to return our call.  We will assume that you have returned to your regular daily activities without incident.  If any biopsies were taken you will be contacted by phone or by letter within the next 1-3 weeks.  Please call us at [phone] if you have not heard about the biopsies in 3 weeks.    SIGNATURES/CONFIDENTIALITY: You and/or your care partner have signed paperwork which will be entered into your electronic medical record.  These signatures attest to the fact that that the information above on your After Visit Summary has been reviewed and is understood.  Full responsibility of the confidentiality of this discharge information lies with you and/or your care-partner. 

## 2017-11-29 NOTE — Op Note (Signed)
[facility] Patient Name: Anita Brandt Procedure Date: 11/29/2017 11:03 AM MRN: [id number] Endoscopist: Jerene Bears , MD Age: 52 Referring MD:  Date of Birth: 1966-07-28 Gender: Female Account #: [id number] Procedure:                Colonoscopy Indications:              Screening for colorectal malignant neoplasm, This                            is the patient's first colonoscopy Medicines:                Monitored Anesthesia Care Procedure:                Pre-Anesthesia Assessment:                           - Prior to the procedure, a History and Physical                            was performed, and patient medications and                            allergies were reviewed. The patient's tolerance of                            previous anesthesia was also reviewed. The risks                            and benefits of the procedure and the sedation                            options and risks were discussed with the patient.                            All questions were answered, and informed consent                            was obtained. Prior Anticoagulants: The patient has                            taken no previous anticoagulant or antiplatelet                            agents. ASA Grade Assessment: II - A patient with                            mild systemic disease. After reviewing the risks                            and benefits, the patient was deemed in                            satisfactory condition to undergo the procedure.  After obtaining informed consent, the colonoscope                            was passed under direct vision. Throughout the                            procedure, the patient's blood pressure, pulse, and                            oxygen saturations were monitored continuously. The                            Colonoscope was introduced through the anus and                            advanced to the the cecum,  identified by                            appendiceal orifice and ileocecal valve. The                            colonoscopy was performed without difficulty. The                            patient tolerated the procedure well. The quality                            of the bowel preparation was good. The ileocecal                            valve, appendiceal orifice, and rectum were                            photographed. Scope In: 11:18:52 AM Scope Out: 11:32:38 AM Scope Withdrawal Time: 0 hours 10 minutes 20 seconds  Total Procedure Duration: 0 hours 13 minutes 46 seconds  Findings:                 The digital rectal exam was normal.                           A 3 mm polyp was found in the ileocecal valve. The                            polyp was sessile. The polyp was removed with a                            cold biopsy forceps. Resection and retrieval were                            complete.                           A 5 mm polyp was found in the ascending colon. The  polyp was sessile. The polyp was removed with a                            cold snare. Resection and retrieval were complete.                           A few small-mouthed diverticula were found in the                            sigmoid colon.                           The exam was otherwise without abnormality on                            direct and retroflexion views. Complications:            No immediate complications. Estimated Blood Loss:     Estimated blood loss was minimal. Impression:               - One 3 mm polyp at the ileocecal valve, removed                            with a cold biopsy forceps. Resected and retrieved.                           - One 5 mm polyp in the ascending colon, removed                            with a cold snare. Resected and retrieved.                           - Mild diverticulosis in the sigmoid colon.                           - The examination  was otherwise normal on direct                            and retroflexion views. Recommendation:           - Patient has a contact number available for                            emergencies. The signs and symptoms of potential                            delayed complications were discussed with the                            patient. Return to normal activities tomorrow.                            Written discharge instructions were provided to the  patient.                           - Resume previous diet.                           - Continue present medications.                           - Await pathology results.                           - Repeat colonoscopy is recommended. The                            colonoscopy date will be determined after pathology                            results from today's exam become available for                            review. Jerene Bears, MD 11/29/2017 11:38:05 AM This report has been signed electronically.

## 2017-11-29 NOTE — Progress Notes (Signed)
Called to room to assist during endoscopic procedure.  Patient ID and intended procedure confirmed with present staff. Received instructions for my participation in the procedure from the performing physician.  

## 2017-11-30 ENCOUNTER — Telehealth: Payer: Self-pay | Admitting: *Deleted

## 2017-11-30 NOTE — Telephone Encounter (Signed)
  Follow up Call-  Call back number 11/29/2017  Post procedure Call Back phone  # [phone]  Permission to leave phone message Yes  Some recent data might be hidden     Patient questions:  Do you have a fever, pain , or abdominal swelling? No. Pain Score  0 *  Have you tolerated food without any problems? Yes.    Have you been able to return to your normal activities? Yes.    Do you have any questions about your discharge instructions: Diet   No. Medications  No. Follow up visit  No.  Do you have questions or concerns about your Care? No.  Actions: * If pain score is 4 or above: No action needed, pain <4.

## 2017-12-02 ENCOUNTER — Encounter: Payer: Self-pay | Admitting: Internal Medicine

## 2018-01-03 ENCOUNTER — Other Ambulatory Visit: Payer: Self-pay | Admitting: Obstetrics and Gynecology

## 2018-01-03 DIAGNOSIS — N6310 Unspecified lump in the right breast, unspecified quadrant: Secondary | ICD-10-CM

## 2018-01-06 ENCOUNTER — Ambulatory Visit
Admission: RE | Admit: 2018-01-06 | Discharge: 2018-01-06 | Disposition: A | Discharge status: Home / Self Care | Payer: 59 | Source: Ambulatory Visit | Attending: Obstetrics and Gynecology | Admitting: Obstetrics and Gynecology

## 2018-01-06 ENCOUNTER — Other Ambulatory Visit: Payer: Self-pay | Admitting: Obstetrics and Gynecology

## 2018-01-06 DIAGNOSIS — N6310 Unspecified lump in the right breast, unspecified quadrant: Secondary | ICD-10-CM

## 2018-01-13 ENCOUNTER — Other Ambulatory Visit: Payer: Self-pay | Admitting: Internal Medicine

## 2018-01-22 ENCOUNTER — Other Ambulatory Visit: Payer: Self-pay | Admitting: Internal Medicine

## 2018-01-22 DIAGNOSIS — J301 Allergic rhinitis due to pollen: Secondary | ICD-10-CM

## 2018-01-31 ENCOUNTER — Other Ambulatory Visit: Payer: Self-pay | Admitting: Internal Medicine

## 2018-01-31 DIAGNOSIS — E663 Overweight: Secondary | ICD-10-CM

## 2018-01-31 NOTE — Telephone Encounter (Signed)
A medication refill was received from pharmacy for Qsymia 15-92 mg. Rx was pended to provider for approval  after verifying last fill date, provider, and quantity on PMP [place]

## 2018-02-23 ENCOUNTER — Encounter: Payer: Self-pay | Admitting: Neurology

## 2018-02-23 ENCOUNTER — Ambulatory Visit (INDEPENDENT_AMBULATORY_CARE_PROVIDER_SITE_OTHER): Payer: 59 | Admitting: Neurology

## 2018-02-23 VITALS — BP 102/69 | HR 91 | Ht 64.0 in | Wt 159.0 lb

## 2018-02-23 DIAGNOSIS — M5417 Radiculopathy, lumbosacral region: Secondary | ICD-10-CM

## 2018-02-23 MED ORDER — GABAPENTIN 300 MG PO CAPS: 300.0000 mg | ORAL_CAPSULE | Freq: Three times a day (TID) | ORAL | 11 refills | Status: DC

## 2018-02-23 MED ORDER — METHYLPREDNISOLONE 4 MG PO TBPK: ORAL_TABLET | ORAL | 1 refills | Status: DC

## 2018-02-23 NOTE — Progress Notes (Addendum)
[facility]    Provider:  Dr Jaynee Eagles Referring Provider: Gildardo Cranker, DO Primary Care Physician:  Gildardo Cranker, DO  CC:  Radiculopathy  Interval history: She continues to have pain in her left buttocks with radiation down the left leg. Symptoms started years agp and radiates down to the foot. MRI in the past showed  marked scoliosis and spondylitic changes throughout most prominent at L 3-4 and L4-5 where there is left greater than right foraminal narrowing. Sitting for long periods and laying for long periods make it worse. She has conservative treatment for over a year, she went on a diet and lost weight, tried neurontin and OTC analgesics, she went to physical therapy, EMG/NCS showed acute/ongoing denervation in the left paraspinals c/w ongoing nerve pinching. She wants to do the shots will send to [facility] and if doesn't make a difference will refer to [place] [facility] and reorder MRI lumbar. She did extremely well with epidural injection on the left at L5-S1 with Dr. Maree Erie. She still worries about memory loss, reassured patient and empathized with her FHx od dementia, recommend the MIND diet out of rush.  HPI:  Anita Brandt is a 52 y.o. female here as a new referral from Dr. Eulas Post for a new problem memory loss. Past medical history of hypertension, under a lot of stress. She was seen several years ago for left-sided paresthesias and MRi brain was normal, had degenerative changes in the neck and lumbar spine and had epidural injections the symptoms have resolved. She felt better when she was on a diet and lost 10 pounds. Paternal grandmother had dementia. She is under a lot of stress with her husband's cancer. She is menopausal. Her memory isn't "there". She really started noticing it in December after her husband finished radiation therapy and was trying to regain normalcy. This past weekend she couldn't remember the name of the comedian they saw that Friday in TV  but she did fall asleep. She takes Ambien at night to sleep. Husband has a feeding tube and a feeding pump and she lods it and so does not sleep well. Possibly getting slightly worse. She couldn't remember the 3 words on the MMSE. She has to write everything down, if it doesn't make it on the list it will not get done. She is back to work recently as well just came off of FMLA. She does not get lost driving. She does forget names but not of people who are close to her, she hadn't seen a friend for a year and forgot her name in the grocery store, bills are paid, she cares for her husband. 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 No other focal neurologic deficits, associated symptoms, inciting events or modifiable factors.   Reviewed notes, labs and imaging from outside physicians, which showed: Reviewed primary care notes. Patient is concerned about short-term memory loss and forgetfulness.She is menopausal without an elevated [place] 2016. Her grandmother has Alzheimer's. She has become much more forgetful lately.She has also been under a lot of stress. Her husband had tonsillar cancer. He has continuing issues with infusion feeding tube to deal with. She has a 16 year old daughter. She has insomnia and uses Ambien at night. Reviewed physical exam which was normal including blood pressure, head, ears, eyes, nose, throat, lungs, heart, abdomen.  Hemoglobin 10.7 mildly anemic in the past but last CBC was normal with hemoglobin 13, TSH 0.94, vitamin D 34  She was seen in the past in August  2015 for  left-sided paresthesias and MRI of the brain, lumbar and cervical spine and EMG nerve conduction studies were ordered. MRI of the brain was normal. Cervical and lumbar showed degenrative disease but normal cord and cauda equina.   personally reviewed images mri brain 06/2014 which was normal  Review of Systems: Patient complains of symptoms per HPI as well as the following symptoms: Memory loss, no shortness of  breath, no chest pain. Pertinent negatives per HPI. All others negative.   Social History   Socioeconomic History  . Marital status: Married    Spouse name: Not on file  . Number of children: 1  . Years of education: BS  . Highest education level: Not on file  Occupational History  . 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 Financial resource strain: Not on file  . Food insecurity:    Worry: Not on file    Inability: Not on file  . Transportation needs:    Medical: Not on file    Non-medical: Not on file  Tobacco Use  . Smoking status: Never Smoker  . Smokeless tobacco: Never Used  Substance and Sexual Activity  . Alcohol use: Yes    Alcohol/week: 3.0 oz    Types: 6 Standard drinks or equivalent per week    Comment: less than 5 times per week ( beer, wine and hard liquor)  . Drug use: No  . Sexual activity: Not Currently  Lifestyle  . Physical activity:    Days per week: Not on file    Minutes per session: Not on file  . Stress: Not on file  Relationships  . Social connections:    Talks on phone: Not on file    Gets together: Not on file    Attends religious service: Not on file    Active member of club or organization: Not on file    Attends meetings of clubs or organizations: Not on file    Relationship status: Not on file  . Intimate partner violence:    Fear of current or ex partner: Not on file    Emotionally abused: Not on file    Physically abused: Not on file    Forced sexual activity: Not on file  Other Topics Concern  . Not on file  Social History Narrative   Patient is married with one child.   Patient is right handed.   Patient has a BS degree.   Patient drinks 1 8 oz caffeine drink daily at the most.            Diet: depends daily   Do you drink/eat things with caffeine? Yes   Marital status: Married                             What year were you married? 2012   Do you live in a house,  apartment, assisted living, condo, trailer, etc)?  House   Is it one or more stories? 2 stories   How many persons live in your home? 3   Do you have any pets in your home? No pets   Current or past profession: Psychologist, prison and probation services   Do you exercise?     sometimes  Type & how often: Irregular   Do you have a living will? No   Do you have a DNR Form? No   Do you have a POA/HPOA forms? No    Family History  Problem Relation Age of Onset  . Cancer Father        lung cancer  . Hypertension Father   . Hypertension Mother   . Arthritis Mother   . Cancer Maternal Grandmother   . COPD Paternal 15   . Alzheimer's disease Paternal Grandmother   . Colon cancer Neg Hx   . Esophageal cancer Neg Hx   . Liver cancer Neg Hx   . Pancreatic cancer Neg Hx   . Rectal cancer Neg Hx   . Stomach cancer Neg Hx     Past Medical History:  Diagnosis Date  . Allergy   . Anemia   . GERD (gastroesophageal reflux disease)   . High blood pressure   . Leg pain   . Numbness   . Perimenopause     Past Surgical History:  Procedure Laterality Date  . BREAST EXCISIONAL BIOPSY Left    30 years ago; benign  . Cyst removed from Left Breast  1984  . DILATION AND CURETTAGE OF UTERUS     Uterine abalation  . TUBAL LIGATION    . WISDOM TOOTH EXTRACTION      Current Outpatient Medications  Medication Sig Dispense Refill  . cetirizine (ZYRTEC) 10 MG tablet Take 10 mg by mouth daily.    . Cholecalciferol (VITAMIN D3) 2000 units TABS Take 2,000 Units by mouth daily.    . fluticasone (FLONASE) 50 MCG/ACT nasal spray SHAKE LIQUID AND USE 1 SPRAY IN EACH NOSTRIL DAILY 16 g 3  . ibuprofen (ADVIL,MOTRIN) 800 MG tablet Take 1 tablet (800 mg total) by mouth every 8 (eight) hours as needed for mild pain or moderate pain. 30 tablet 0  . montelukast (SINGULAIR) 10 MG tablet Take 10 mg by mouth at bedtime.    Marland Kitchen olmesartan-hydrochlorothiazide (BENICAR HCT) 40-25 MG tablet TAKE  ONE TABLET BY MOUTH ONCE DAILY 30 tablet 5  . OVER THE COUNTER MEDICATION Vitamin C, One tablet daily.    Marland Kitchen OVER THE COUNTER MEDICATION Goody's Powder Extra Strenght. One packet PRN as needed for headache.    . QSYMIA 15-92 MG CP24 TAKE ONE CAPSULE BY MOUTH EVERY DAY 30 capsule 0  . zolpidem (AMBIEN) 5 MG tablet Take 1 tablet (5 mg total) by mouth at bedtime as needed for sleep. 30 tablet 1   Current Facility-Administered Medications  Medication Dose Route Frequency Provider Last Rate Last Dose  . 0.9 %  sodium chloride infusion  500 mL Intravenous Once Pyrtle, Lajuan Lines, MD        Allergies as of 02/23/2018 - Review Complete 02/23/2018  Allergen Reaction Noted  . Penicillin g potassium in d5w  06/18/2014  . Sulfa antibiotics  06/18/2014    Vitals: BP 102/69 (BP Location: Right Arm, Patient Position: Sitting)   Pulse 91   Ht 5\' 4"  (1.626 m)   Wt 159 lb (72.1 kg)   LMP  (LMP Unknown)   BMI 27.29 kg/m  Last Weight:  Wt Readings from Last 1 Encounters:  02/23/18 159 lb (72.1 kg)   Last Height:   Ht Readings from Last 1 Encounters:  02/23/18 5\' 4"  (1.626 m)    Physical exam: Exam: Gen: NAD, conversant, well nourised, obese, well groomed  CV: RRR, no MRG. No Carotid Bruits. No peripheral edema, warm, nontender Eyes: Conjunctivae clear without exudates or hemorrhage  Neuro: Detailed Neurologic Exam  Speech:    Speech is normal; fluent and spontaneous with normal comprehension.  Cognition:  MMSE - Mini Mental State Exam 12/27/2016  Orientation to time 5  Orientation to Place 5  Registration 3  Attention/ Calculation 5  Recall 2  Language- name 2 objects 2  Language- repeat 1  Language- follow 3 step command 2  Language- read & follow direction 1  Write a sentence 1  Copy design 1  Total score 28      The patient is oriented to person, place, and time;     recent and remote memory intact;     language fluent;     normal attention, concentration,      fund of knowledge Cranial Nerves:    The pupils are equal, round, and reactive to light. The fundi are normal and spontaneous venous pulsations are present. Visual fields are full to finger confrontation. Extraocular movements are intact. Trigeminal sensation is intact and the muscles of mastication are normal. The face is symmetric. The palate elevates in the midline. Hearing intact. Voice is normal. Shoulder shrug is normal. The tongue has normal motion without fasciculations.   Coordination:    Normal finger to nose and heel to shin. Normal rapid alternating movements.   Gait:    Heel-toe and tandem gait are normal.   Motor Observation:    No asymmetry, no atrophy, and no involuntary movements noted. Tone:    Normal muscle tone.    Posture:    Posture is normal. normal erect    Strength:    Strength is V/V in the upper and lower limbs.      Sensation: intact to LT     Reflex Exam:  DTR's:    Deep tendon reflexes in the upper and lower extremities are normal bilaterally.   Toes:    The toes are downgoing bilaterally.   Clonus:    Clonus is absent.      Assessment/Plan:   52 y.o. female with L5/S1 left radiculopathy. She did extremely well with epidural injection on the left at L5-S1 with Dr. Maree Erie and would like to repeat. EMG/NCS confimred left acute/ongoing radiculopathy.   - order ESI with Dr. Maree Erie or Dr. Jobe Igo, left L5-S1 - Discussed MIND diet for memory loss - MRI brain normal - Called [facility], discussed with team, patient scheduled. - Neurontin and medrol dosepak for pain  Sarina Ill, MD  [facility] [address] [place], [place] [address]  Phone [phone] Fax [phone]  A total of 25 minutes was spent face-to-face with this patient. Over half this time was spent on counseling patient on the radiculopathy, memory loss diagnosis and different diagnostic and therapeutic options or risk factor  reduction and education.

## 2018-02-23 NOTE — Patient Instructions (Addendum)
Epidural steroid injections with Dr. Maree Erie  Gabapentin capsules or tablets What is this medicine? GABAPENTIN (GA ba pen tin) is used to control partial seizures in adults with epilepsy. It is also used to treat certain types of nerve pain. This medicine may be used for other purposes; ask your health care provider or pharmacist if you have questions. COMMON BRAND NAME(S): Active-PAC with Gabapentin, Gabarone, Neurontin What should I tell my health care provider before I take this medicine? They need to know if you have any of these conditions: -kidney disease -suicidal thoughts, plans, or attempt; a previous suicide attempt by you or a family member -an unusual or allergic reaction to gabapentin, other medicines, foods, dyes, or preservatives -pregnant or trying to get pregnant -breast-feeding How should I use this medicine? Take this medicine by mouth with a glass of water. Follow the directions on the prescription label. You can take it with or without food. If it upsets your stomach, take it with food.Take your medicine at regular intervals. Do not take it more often than directed. Do not stop taking except on your doctor's advice. If you are directed to break the 600 or 800 mg tablets in half as part of your dose, the extra half tablet should be used for the next dose. If you have not used the extra half tablet within 28 days, it should be thrown away. A special MedGuide will be given to you by the pharmacist with each prescription and refill. Be sure to read this information carefully each time. Talk to your pediatrician regarding the use of this medicine in children. Special care may be needed. Overdosage: If you think you have taken too much of this medicine contact a poison control center or emergency room at once. NOTE: This medicine is only for you. Do not share this medicine with others. What if I miss a dose? If you miss a dose, take it as soon as you can. If it is almost time for  your next dose, take only that dose. Do not take double or extra doses. What may interact with this medicine? Do not take this medicine with any of the following medications: -other gabapentin products This medicine may also interact with the following medications: -alcohol -antacids -antihistamines for allergy, cough and cold -certain medicines for anxiety or sleep -certain medicines for depression or psychotic disturbances -homatropine; hydrocodone -naproxen -narcotic medicines (opiates) for pain -phenothiazines like chlorpromazine, mesoridazine, prochlorperazine, thioridazine This list may not describe all possible interactions. Give your health care provider a list of all the medicines, herbs, non-prescription drugs, or dietary supplements you use. Also tell them if you smoke, drink alcohol, or use illegal drugs. Some items may interact with your medicine. What should I watch for while using this medicine? Visit your doctor or health care professional for regular checks on your progress. You may want to keep a record at home of how you feel your condition is responding to treatment. You may want to share this information with your doctor or health care professional at each visit. You should contact your doctor or health care professional if your seizures get worse or if you have any new types of seizures. Do not stop taking this medicine or any of your seizure medicines unless instructed by your doctor or health care professional. Stopping your medicine suddenly can increase your seizures or their severity. Wear a medical identification bracelet or chain if you are taking this medicine for seizures, and carry a card that lists all  your medications. You may get drowsy, dizzy, or have blurred vision. Do not drive, use machinery, or do anything that needs mental alertness until you know how this medicine affects you. To reduce dizzy or fainting spells, do not sit or stand up quickly, especially if  you are an older patient. Alcohol can increase drowsiness and dizziness. Avoid alcoholic drinks. Your mouth may get dry. Chewing sugarless gum or sucking hard candy, and drinking plenty of water will help. The use of this medicine may increase the chance of suicidal thoughts or actions. Pay special attention to how you are responding while on this medicine. Any worsening of mood, or thoughts of suicide or dying should be reported to your health care professional right away. Women who become pregnant while using this medicine may enroll in the [place] Pregnancy Registry by calling [phone]. This registry collects information about the safety of antiepileptic drug use during pregnancy. What side effects may I notice from receiving this medicine? Side effects that you should report to your doctor or health care professional as soon as possible: -allergic reactions like skin rash, itching or hives, swelling of the face, lips, or tongue -worsening of mood, thoughts or actions of suicide or dying Side effects that usually do not require medical attention (report to your doctor or health care professional if they continue or are bothersome): -constipation -difficulty walking or controlling muscle movements -dizziness -nausea -slurred speech -tiredness -tremors -weight gain This list may not describe all possible side effects. Call your doctor for medical advice about side effects. You may report side effects to FDA at 1-800-FDA-1088. Where should I keep my medicine? Keep out of reach of children. This medicine may cause accidental overdose and death if it taken by other adults, children, or pets. Mix any unused medicine with a substance like cat litter or coffee grounds. Then throw the medicine away in a sealed container like a sealed bag or a coffee can with a lid. Do not use the medicine after the expiration date. Store at room temperature between 15 and 30 degrees C  (59 and 86 degrees F). NOTE: This sheet is a summary. It may not cover all possible information. If you have questions about this medicine, talk to your doctor, pharmacist, or health care provider.  2018 Elsevier/Gold Standard (2013-12-07 15:26:50)   Methylprednisolone tablets What is this medicine? METHYLPREDNISOLONE (meth ill pred NISS oh lone) is a corticosteroid. It is commonly used to treat inflammation of the skin, joints, lungs, and other organs. Common conditions treated include asthma, allergies, and arthritis. It is also used for other conditions, such as blood disorders and diseases of the adrenal glands. This medicine may be used for other purposes; ask your health care provider or pharmacist if you have questions. COMMON BRAND NAME(S): Medrol, Medrol Dosepak What should I tell my health care provider before I take this medicine? They need to know if you have any of these conditions: -Cushing's syndrome -eye disease, vision problems -diabetes -glaucoma -heart disease -high blood pressure -infection (especially a virus infection such as chickenpox, cold sores, or herpes) -liver disease -mental illness -myasthenia gravis -osteoporosis -recently received or scheduled to receive a vaccine -seizures -stomach or intestine problems -thyroid disease -an unusual or allergic reaction to lactose, methylprednisolone, other medicines, foods, dyes, or preservatives -pregnant or trying to get pregnant -breast-feeding How should I use this medicine? Take this medicine by mouth with a glass of water. Follow the directions on the prescription label. Take this medicine  with food. If you are taking this medicine once a day, take it in the morning. Do not take it more often than directed. Do not suddenly stop taking your medicine because you may develop a severe reaction. Your doctor will tell you how much medicine to take. If your doctor wants you to stop the medicine, the dose may be slowly  lowered over time to avoid any side effects. Talk to your pediatrician regarding the use of this medicine in children. Special care may be needed. Overdosage: If you think you have taken too much of this medicine contact a poison control center or emergency room at once. NOTE: This medicine is only for you. Do not share this medicine with others. What if I miss a dose? If you miss a dose, take it as soon as you can. If it is almost time for your next dose, talk to your doctor or health care professional. You may need to miss a dose or take an extra dose. Do not take double or extra doses without advice. What may interact with this medicine? Do not take this medicine with any of the following medications: -alefacept -echinacea -live virus vaccines -metyrapone -mifepristone This medicine may also interact with the following medications: -amphotericin B -aspirin and aspirin-like medicines -certain antibiotics like erythromycin, clarithromycin, troleandomycin -certain medicines for diabetes -certain medicines for fungal infections like ketoconazole -certain medicines for seizures like carbamazepine, phenobarbital, phenytoin -certain medicines that treat or prevent blood clots like warfarin -cholestyramine -cyclosporine -digoxin -diuretics -female hormones, like estrogens and birth control pills -isoniazid -NSAIDs, medicines for pain inflammation, like ibuprofen or naproxen -other medicines for myasthenia gravis -rifampin -vaccines This list may not describe all possible interactions. Give your health care provider a list of all the medicines, herbs, non-prescription drugs, or dietary supplements you use. Also tell them if you smoke, drink alcohol, or use illegal drugs. Some items may interact with your medicine. What should I watch for while using this medicine? Tell your doctor or healthcare professional if your symptoms do not start to get better or if they get worse. Do not stop  taking except on your doctor's advice. You may develop a severe reaction. Your doctor will tell you how much medicine to take. This medicine may increase your risk of getting an infection. Tell your doctor or health care professional if you are around anyone with measles or chickenpox, or if you develop sores or blisters that do not heal properly. This medicine may affect blood sugar levels. If you have diabetes, check with your doctor or health care professional before you change your diet or the dose of your diabetic medicine. Tell your doctor or health care professional right away if you have any change in your eyesight. Using this medicine for a long time may increase your risk of low bone mass. Talk to your doctor about bone health. What side effects may I notice from receiving this medicine? Side effects that you should report to your doctor or health care professional as soon as possible: -allergic reactions like skin rash, itching or hives, swelling of the face, lips, or tongue -bloody or tarry stools -changes in vision -hallucination, loss of contact with reality -muscle cramps -muscle pain -palpitations -signs and symptoms of high blood sugar such as dizziness; dry mouth; dry skin; fruity breath; nausea; stomach pain; increased hunger or thirst; increased urination -signs and symptoms of infection like fever or chills; cough; sore throat; pain or trouble passing urine -trouble passing urine or change in  the amount of urine Side effects that usually do not require medical attention (report to your doctor or health care professional if they continue or are bothersome): -changes in emotions or mood -constipation -diarrhea -excessive hair growth on the face or body -headache -nausea, vomiting -trouble sleeping -weight gain This list may not describe all possible side effects. Call your doctor for medical advice about side effects. You may report side effects to FDA at  1-800-FDA-1088. Where should I keep my medicine? Keep out of the reach of children. Store at room temperature between 20 and 25 degrees C (68 and 77 degrees F). Throw away any unused medicine after the expiration date. NOTE: This sheet is a summary. It may not cover all possible information. If you have questions about this medicine, talk to your doctor, pharmacist, or health care provider.  2018 Elsevier/Gold Standard (2015-12-18 15:53:30)   Epidural Steroid Injection Patient Information  Description: The epidural space surrounds the nerves as they exit the spinal cord.  In some patients, the nerves can be compressed and inflamed by a bulging disc or a tight spinal canal (spinal stenosis).  By injecting steroids into the epidural space, we can bring irritated nerves into direct contact with a potentially helpful medication.  These steroids act directly on the irritated nerves and can reduce swelling and inflammation which often leads to decreased pain.  Epidural steroids may be injected anywhere along the spine and from the neck to the low back depending upon the location of your pain.   After numbing the skin with local anesthetic (like Novocaine), a small needle is passed into the epidural space slowly.  You may experience a sensation of pressure while this is being done.  The entire block usually last less than 10 minutes.  Conditions which may be treated by epidural steroids:   Low back and leg pain  Neck and arm pain  Spinal stenosis  Post-laminectomy syndrome  Herpes zoster (shingles) pain  Pain from compression fractures  Preparation for the injection:  1. Do not eat any solid food or dairy products within 8 hours of your appointment.  2. You may drink clear liquids up to 3 hours before appointment.  Clear liquids include water, black coffee, juice or soda.  No milk or cream please. 3. You may take your regular medication, including pain medications, with a sip of water  before your appointment  Diabetics should hold regular insulin (if taken separately) and take 1/2 normal NPH dos the morning of the procedure.  Carry some sugar containing items with you to your appointment. 4. A driver must accompany you and be prepared to drive you home after your procedure.  5. Bring all your current medications with your. 6. An IV may be inserted and sedation may be given at the discretion of the physician.   7. A blood pressure cuff, EKG and other monitors will often be applied during the procedure.  Some patients may need to have extra oxygen administered for a short period. 8. You will be asked to provide medical information, including your allergies, prior to the procedure.  We must know immediately if you are taking blood thinners (like Coumadin/Warfarin)  Or if you are allergic to IV iodine contrast (dye). We must know if you could possible be pregnant.  Possible side-effects:  Bleeding from needle site  Infection (rare, may require surgery)  Nerve injury (rare)  Numbness & tingling (temporary)  Difficulty urinating (rare, temporary)  Spinal headache ( a headache worse with  upright posture)  Light -headedness (temporary)  Pain at injection site (several days)  Decreased blood pressure (temporary)  Weakness in arm/leg (temporary)  Pressure sensation in back/neck (temporary)  Call if you experience:  Fever/chills associated with headache or increased back/neck pain.  Headache worsened by an upright position.  New onset weakness or numbness of an extremity below the injection site  Hives or difficulty breathing (go to the emergency room)  Inflammation or drainage at the infection site  Severe back/neck pain  Any new symptoms which are concerning to you  Please note:  Although the local anesthetic injected can often make your back or neck feel good for several hours after the injection, the pain will likely return.  It takes 3-7 days for  steroids to work in the epidural space.  You may not notice any pain relief for at least that one week.  If effective, we will often do a series of three injections spaced 3-6 weeks apart to maximally decrease your pain.  After the initial series, we generally will wait several months before considering a repeat injection of the same type.  If you have any questions, please call [phone] [facility]  Lumbosacral Radiculopathy Lumbosacral radiculopathy is a condition that involves the spinal nerves and nerve roots in the low back and bottom of the spine. The condition develops when these nerves and nerve roots move out of place or become inflamed and cause symptoms. What are the causes? This condition may be caused by:  Pressure from a disk that bulges out of place (herniated disk). A disk is a plate of cartilage that separates bones in the spine.  Disk degeneration.  A narrowing of the bones of the lower back (spinal stenosis).  A tumor.  An infection.  An injury that places sudden pressure on the disks that cushion the bones of your lower spine.  What increases the risk? This condition is more likely to develop in:  Males aged 30-50 years.  Females aged 15-60 years.  People who lift improperly.  People who are overweight or live a sedentary lifestyle.  People who smoke.  People who perform repetitive activities that strain the spine.  What are the signs or symptoms? Symptoms of this condition include:  Pain that goes down from the back into the legs (sciatica). This is the most common symptom. The pain may be worse with sitting, coughing, or sneezing.  Pain and numbness in the arms and legs.  Muscle weakness.  Tingling.  Loss of bladder control or bowel control.  How is this diagnosed? This condition is diagnosed with a physical exam and medical history. If the pain is lasting, you may have tests, such as:  MRI  scan.  X-ray.  CT scan.  Myelogram.  Nerve conduction study.  How is this treated? This condition is often treated with:  Hot packs and ice applied to affected areas.  Stretches to improve flexibility.  Exercises to strengthen back muscles.  Physical therapy.  Pain medicine.  A steroid injection in the spine.  In some cases, no treatment is needed. If the condition is long-lasting (chronic), or if symptoms are severe, treatment may involve surgery or lifestyle changes, such as following a weight loss plan. Follow these instructions at home: Medicines  Take medicines only as directed by your health care provider.  Do not drive or operate heavy machinery while taking pain medicine. Injury care  Apply a heat pack to the injured area as  directed by your health care provider.  Apply ice to the affected area: ? Put ice in a plastic bag. ? Place a towel between your skin and the bag. ? Leave the ice on for 20-30 minutes, every 2 hours while you are awake or as needed. Or, leave the ice on for as long as directed by your health care provider. Other Instructions  If you were shown how to do any exercises or stretches, do them as directed by your health care provider.  If your health care provider prescribed a diet or exercise program, follow it as directed.  Keep all follow-up visits as directed by your health care provider. This is important. Contact a health care provider if:  Your pain does not improve over time even when taking pain medicines. Get help right away if:  Your develop severe pain.  Your pain suddenly gets worse.  You develop increasing weakness in your legs.  You lose the ability to control your bladder or bowel.  You have difficulty walking or balancing.  You have a fever. This information is not intended to replace advice given to you by your health care provider. Make sure you discuss any questions you have with your health care  provider. Document Released: 10/11/2005 Document Revised: 03/18/2016 Document Reviewed: 10/07/2014 Elsevier Interactive Patient Education  [employer].

## 2018-02-23 NOTE — Addendum Note (Signed)
Addended by: Sarina Ill B on: 02/23/2018 10:49 AM   Modules accepted: Orders

## 2018-02-27 ENCOUNTER — Other Ambulatory Visit: Payer: Self-pay | Admitting: Internal Medicine

## 2018-02-27 DIAGNOSIS — E663 Overweight: Secondary | ICD-10-CM

## 2018-02-27 DIAGNOSIS — M419 Scoliosis, unspecified: Secondary | ICD-10-CM

## 2018-02-28 NOTE — Telephone Encounter (Signed)
[place] Database verified and compliance confirmed   

## 2018-03-06 ENCOUNTER — Ambulatory Visit
Admission: RE | Admit: 2018-03-06 | Discharge: 2018-03-06 | Disposition: A | Discharge status: Home / Self Care | Payer: 59 | Source: Ambulatory Visit | Attending: Neurology | Admitting: Neurology

## 2018-03-06 DIAGNOSIS — M5417 Radiculopathy, lumbosacral region: Secondary | ICD-10-CM

## 2018-03-06 MED ORDER — METHYLPREDNISOLONE ACETATE 40 MG/ML INJ SUSP (RADIOLOG
120.0000 mg | Freq: Once | INTRAMUSCULAR | Status: AC
  Administered 2018-03-06: 120 mg via EPIDURAL

## 2018-03-06 MED ORDER — IOPAMIDOL (ISOVUE-M 200) INJECTION 41%
1.0000 mL | Freq: Once | INTRAMUSCULAR | Status: AC
  Administered 2018-03-06: 1 mL via EPIDURAL

## 2018-03-06 NOTE — Discharge Instructions (Signed)

## 2018-03-29 ENCOUNTER — Other Ambulatory Visit: Payer: Self-pay | Admitting: Internal Medicine

## 2018-03-29 DIAGNOSIS — M419 Scoliosis, unspecified: Secondary | ICD-10-CM

## 2018-03-29 DIAGNOSIS — E663 Overweight: Secondary | ICD-10-CM

## 2018-03-29 NOTE — Telephone Encounter (Signed)
A medication refill was received from pharmacy for ibuprofen 800 mg and Qsymia 15-92 mg. Rx was called in to pharmacy after verifying last fill date, provider, and quantity on PMP [place].

## 2018-04-04 ENCOUNTER — Ambulatory Visit: Payer: 59 | Admitting: Internal Medicine

## 2018-04-18 ENCOUNTER — Ambulatory Visit: Payer: 59 | Admitting: Internal Medicine

## 2018-04-28 ENCOUNTER — Encounter: Payer: Self-pay | Admitting: Internal Medicine

## 2018-04-28 ENCOUNTER — Ambulatory Visit (INDEPENDENT_AMBULATORY_CARE_PROVIDER_SITE_OTHER): Payer: 59 | Admitting: Internal Medicine

## 2018-04-28 VITALS — BP 112/68 | HR 93 | Temp 98.1°F | Resp 10 | Ht 64.0 in | Wt 152.0 lb

## 2018-04-28 DIAGNOSIS — Z79899 Other long term (current) drug therapy: Secondary | ICD-10-CM | POA: Diagnosis not present

## 2018-04-28 DIAGNOSIS — J301 Allergic rhinitis due to pollen: Secondary | ICD-10-CM | POA: Diagnosis not present

## 2018-04-28 DIAGNOSIS — I10 Essential (primary) hypertension: Secondary | ICD-10-CM | POA: Diagnosis not present

## 2018-04-28 DIAGNOSIS — E663 Overweight: Secondary | ICD-10-CM | POA: Diagnosis not present

## 2018-04-28 DIAGNOSIS — E559 Vitamin D deficiency, unspecified: Secondary | ICD-10-CM | POA: Diagnosis not present

## 2018-04-28 MED ORDER — PHENTERMINE-TOPIRAMATE ER 15-92 MG PO CP24: 1.0000 | ORAL_CAPSULE | Freq: Every day | ORAL | 3 refills | Status: DC

## 2018-04-28 NOTE — Patient Instructions (Signed)
Continue current medications as ordered  Increase exercise as tolerated  Follow up in 5 mos for CPE/ECG.

## 2018-04-28 NOTE — Progress Notes (Signed)
Patient ID: Anita Brandt, female   DOB: July 11, 1966, 52 y.o.   MRN: [id number]   Location:  [facility] OFFICE  Provider: DR Arletha Grippe  Code Status:  Goals of Care:  Advanced Directives 11/29/2017  Does Patient Have a Medical Advance Directive? No  Would patient like information on creating a medical advance directive? -     Chief Complaint  Patient presents with  . Medical Management of Chronic Issues    6 month follow-up on HTN   . FYI    Spinal Epidural May 2019   . Medication Refill    Qsymia    HPI: Patient is a 52 y.o. female seen today for medical management of chronic diseases.    Allergic rhinitis - stable on zyrtec, flonase and singulair.  Obesity - stable on qsymia. Weight down 10 lbs. Current BMI 26.08 (prev. 28.39). She completed the Whole 30 diet plan in conjunction with med. She is on Keto diet plan. She snacks. She is not exercising as she should.   Hx elevated ALT  - remains nml - ALT 18  HTN - stable on benicar hct  Hx Vitamin D deficiency - stable. Vit D 25OH level 32. Takes OTC supplement  Hx scoliosis - stable. No worsening back pain or sciatica    Past Medical History:  Diagnosis Date  . Allergy   . Anemia   . GERD (gastroesophageal reflux disease)   . High blood pressure   . Leg pain   . Numbness   . Perimenopause     Past Surgical History:  Procedure Laterality Date  . BREAST EXCISIONAL BIOPSY Left    30 years ago; benign  . Cyst removed from Left Breast  1984  . DILATION AND CURETTAGE OF UTERUS     Uterine abalation  . TUBAL LIGATION    . WISDOM TOOTH EXTRACTION       reports that she has never smoked. She has never used smokeless tobacco. She reports that she drinks about 3.6 oz of alcohol per week. She reports that she does not use drugs. Social History   Socioeconomic History  . Marital status: Married    Spouse name: Not on file  . Number of children: 1  . Years of education: BS  . Highest education level: Not on file    Occupational History  . 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 Financial resource strain: Not on file  . Food insecurity:    Worry: Not on file    Inability: Not on file  . Transportation needs:    Medical: Not on file    Non-medical: Not on file  Tobacco Use  . Smoking status: Never Smoker  . Smokeless tobacco: Never Used  Substance and Sexual Activity  . Alcohol use: Yes    Alcohol/week: 3.6 oz    Types: 6 Standard drinks or equivalent per week    Comment: less than 5 times per week ( beer, wine and hard liquor)  . Drug use: No  . Sexual activity: Not Currently  Lifestyle  . Physical activity:    Days per week: Not on file    Minutes per session: Not on file  . Stress: Not on file  Relationships  . Social connections:    Talks on phone: Not on file    Gets together: Not on file    Attends religious service: Not on file    Active member  of club or organization: Not on file    Attends meetings of clubs or organizations: Not on file    Relationship status: Not on file  . Intimate partner violence:    Fear of current or ex partner: Not on file    Emotionally abused: Not on file    Physically abused: Not on file    Forced sexual activity: Not on file  Other Topics Concern  . Not on file  Social History Narrative   Patient is married with one child.   Patient is right handed.   Patient has a BS degree.   Patient drinks 1 8 oz caffeine drink daily at the most.            Diet: depends daily   Do you drink/eat things with caffeine? Yes   Marital status: Married                             What year were you married? 2012   Do you live in a house, apartment, assisted living, condo, trailer, etc)?  House   Is it one or more stories? 2 stories   How many persons live in your home? 3   Do you have any pets in your home? No pets   Current or past profession: Psychologist, prison and probation services   Do you exercise?     sometimes                                                 Type & how often: Irregular   Do you have a living will? No   Do you have a DNR Form? No   Do you have a POA/HPOA forms? No    Family History  Problem Relation Age of Onset  . Cancer Father        lung cancer  . Hypertension Father   . Hypertension Mother   . Arthritis Mother   . Cancer Maternal Grandmother   . COPD Paternal 82   . Alzheimer's disease Paternal Grandmother   . Colon cancer Neg Hx   . Esophageal cancer Neg Hx   . Liver cancer Neg Hx   . Pancreatic cancer Neg Hx   . Rectal cancer Neg Hx   . Stomach cancer Neg Hx     Allergies  Allergen Reactions  . Penicillin G Potassium In D5w     hives  . Sulfa Antibiotics     hives    Outpatient Encounter Medications as of 04/28/2018  Medication Sig  . cetirizine (ZYRTEC) 10 MG tablet Take 10 mg by mouth daily.  . Cholecalciferol (VITAMIN D3) 2000 units TABS Take 2,000 Units by mouth daily.  . fluticasone (FLONASE) 50 MCG/ACT nasal spray SHAKE LIQUID AND USE 1 SPRAY IN EACH NOSTRIL DAILY  . gabapentin (NEURONTIN) 300 MG capsule Take 1 capsule (300 mg total) by mouth 3 (three) times daily.  Marland Kitchen ibuprofen (ADVIL,MOTRIN) 800 MG tablet TAKE 1 TABLET(800 MG) BY MOUTH DAILY AS NEEDED FOR MILD PAIN OR MODERATE PAIN  . montelukast (SINGULAIR) 10 MG tablet Take 10 mg by mouth at bedtime.  Marland Kitchen olmesartan-hydrochlorothiazide (BENICAR HCT) 40-25 MG tablet TAKE ONE TABLET BY MOUTH ONCE DAILY  . OVER THE COUNTER MEDICATION Vitamin C, One tablet daily.  Marland Kitchen OVER THE COUNTER MEDICATION Goody's Powder Extra Strenght.  One packet PRN as needed for headache.  . QSYMIA 15-92 MG CP24 TAKE 1 CAPSULE BY MOUTH EVERY DAY  . zolpidem (AMBIEN) 5 MG tablet Take 1 tablet (5 mg total) by mouth at bedtime as needed for sleep.  . [DISCONTINUED] methylPREDNISolone (MEDROL DOSEPAK) 4 MG TBPK tablet follow package directions   Facility-Administered Encounter Medications as of 04/28/2018  Medication  . 0.9 %  sodium chloride  infusion    Review of Systems:  Review of Systems  HENT: Positive for sinus pressure.   All other systems reviewed and are negative.   Health Maintenance  Topic Date Due  . TETANUS/TDAP  06/12/1986  . PAP SMEAR  10/31/2017  . INFLUENZA VACCINE  06/07/2018 (Originally 05/25/2018)  . MAMMOGRAM  01/07/2019  . COLONOSCOPY  11/29/2022  . HIV Screening  Completed    Physical Exam: Vitals:   04/28/18 0914  BP: 112/68  Pulse: 93  Resp: 10  Temp: 98.1 F (36.7 C)  TempSrc: Oral  SpO2: 98%  Weight: 152 lb (68.9 kg)  Height: _0  (1.626 m)   Body mass index is 26.09 kg/m. Physical Exam  Constitutional: She is oriented to person, place, and time. She appears well-developed and well-nourished.  HENT:  Mouth/Throat: Oropharynx is clear and moist. No oropharyngeal exudate.  MMM; no oral thrush  Eyes: Pupils are equal, round, and reactive to light. No scleral icterus.  Neck: Neck supple. Carotid bruit is not present. No tracheal deviation present.  Cardiovascular: Normal rate, regular rhythm, normal heart sounds and intact distal pulses. Exam reveals no gallop and no friction rub.  No murmur heard. No LE edema b/l. no calf TTP.   Pulmonary/Chest: Effort normal and breath sounds normal. No stridor. No respiratory distress. She has no wheezes. She has no rales.  Abdominal: Soft. Normal appearance and bowel sounds are normal. She exhibits no distension and no mass. There is no hepatomegaly. There is no tenderness. There is no rigidity, no rebound and no guarding. No hernia.  Lymphadenopathy:    She has no cervical adenopathy.  Neurological: She is alert and oriented to person, place, and time. She has normal reflexes.  Skin: Skin is warm and dry. No rash noted.  Psychiatric: She has a normal mood and affect. Her behavior is normal. Judgment and thought content normal.    Labs reviewed: Basic Metabolic Panel: Recent Labs    09/29/17 0849  NA 140  K 3.8  CL 103  CO2 27    GLUCOSE 88  BUN 21  CREATININE 0.71  CALCIUM 10.0  TSH 0.85   Liver Function Tests: Recent Labs    09/29/17 0849  AST 18  ALT 18  BILITOT 0.8  PROT 7.6   No results for input(s): LIPASE, AMYLASE in the last 8760 hours. No results for input(s): AMMONIA in the last 8760 hours. CBC: Recent Labs    09/29/17 0849  WBC 5.0  NEUTROABS 2,650  HGB 12.2  HCT 36.1  MCV 96.8  PLT 344   Lipid Panel: Recent Labs    09/29/17 0849  CHOL 203*  HDL 99  LDLCALC 93  TRIG 37  CHOLHDL 2.1   Lab Results  Component Value Date   HGBA1C 5.3 06/19/2014    Procedures since last visit: No results found.  Assessment/Plan   ICD-10-CM   1. Overweight (BMI 25.0-29.9) E66.3 Phentermine-Topiramate (QSYMIA) 15-92 MG CP24  2. Allergic rhinitis due to pollen, unspecified seasonality J30.1   3. Benign essential HTN I10   4. High  risk medication use Z79.899 BMP with eGFR([facility])    ALT  5. Vitamin D deficiency E55.9 Vitamin D, 25-hydroxy    Continue current medications as ordered  Increase exercise as tolerated  Follow up in 5 mos for CPE/ECG.   Ericberto Padget S. Perlie Gold  [facility] [address] [place], [place] [address] [phone] Cell (Monday-Friday 8 AM - 5 PM) [phone] After 5 PM and follow prompts

## 2018-04-29 LAB — BASIC METABOLIC PANEL WITH GFR
BUN: 17 mg/dL (ref 7–25)
CO2: 27 mmol/L (ref 20–32)
CREATININE: 0.58 mg/dL (ref 0.50–1.05)
Calcium: 10.2 mg/dL (ref 8.6–10.4)
Chloride: 100 mmol/L (ref 98–110)
GFR, Est African American: 125 mL/min/{1.73_m2} (ref >=60)
GFR, Est Non African American: 107 mL/min/{1.73_m2} (ref >=60)
GLUCOSE: 78 mg/dL (ref 65–99)
Potassium: 4 mmol/L (ref 3.5–5.3)
SODIUM: 138 mmol/L (ref 135–146)

## 2018-04-29 LAB — VITAMIN D 25 HYDROXY (VIT D DEFICIENCY, FRACTURES): Vit D, 25-Hydroxy: 41 ng/mL (ref 30–100)

## 2018-04-29 LAB — ALT: ALT: 30 U/L — AB (ref 6–29)

## 2018-05-04 ENCOUNTER — Other Ambulatory Visit: Payer: Self-pay | Admitting: Internal Medicine

## 2018-05-04 DIAGNOSIS — M419 Scoliosis, unspecified: Secondary | ICD-10-CM

## 2018-05-08 ENCOUNTER — Telehealth: Payer: Self-pay | Admitting: Neurology

## 2018-05-08 NOTE — Telephone Encounter (Signed)
D/w Dr. Jaynee Eagles. Will clarify if pt has received her epidural injection yet.   Spoke with pt. She stated that she got an epidural injection about 2 months ago and was doing excellent. She lifted some water out of the car and now things are downhill. She thinks the injection has worn off and would like another. RN advised pt that she would d/w Dr. Jaynee Eagles and advised pt to try calling GI to see if there are subsequent injections and f/u that can be done or if pt needs a new order. She will call them and then will call our office back if she has not heard by today or tomorrow. Pt appreciative.

## 2018-05-08 NOTE — Telephone Encounter (Signed)
Pt is having pressure in her back since last Sunday 04/30/18. She started the steroid the on 7/8 but has not seen enough relief. She feels pressure in the back but she is concerned she will get to a point of not being able to walk. She is wanting a referral for epidural. Please call to advise

## 2018-05-08 NOTE — Telephone Encounter (Signed)
Anita Brandt called back. She spoke with GI and they requested a new order. Anita Brandt stated her pain is in the same place as before, L buttock. She stated her last injection was around 03/06/18. RN advised she would request a new order from Dr. Jaynee Eagles if she agrees. Anita Brandt appreciative and will await call back with Dr. Cathren Laine decision.

## 2018-05-09 ENCOUNTER — Other Ambulatory Visit: Payer: Self-pay | Admitting: Neurology

## 2018-05-09 DIAGNOSIS — M5416 Radiculopathy, lumbar region: Secondary | ICD-10-CM

## 2018-05-09 NOTE — Telephone Encounter (Signed)
05/09/2018 Order faxed [phone] to [employer] telephone [place] [facility]

## 2018-05-09 NOTE — Telephone Encounter (Signed)
Called pt, she is aware injection ordered by Dr. Jaynee Eagles. She verbalized appreciation.

## 2018-05-09 NOTE — Telephone Encounter (Signed)
Ordered

## 2018-05-16 ENCOUNTER — Encounter: Payer: Self-pay | Admitting: Internal Medicine

## 2018-05-23 ENCOUNTER — Other Ambulatory Visit: Payer: 59

## 2018-05-31 ENCOUNTER — Other Ambulatory Visit: Payer: 59

## 2018-05-31 ENCOUNTER — Other Ambulatory Visit: Payer: Self-pay | Admitting: Internal Medicine

## 2018-05-31 DIAGNOSIS — M419 Scoliosis, unspecified: Secondary | ICD-10-CM

## 2018-06-05 ENCOUNTER — Other Ambulatory Visit: Payer: 59

## 2018-06-14 ENCOUNTER — Encounter: Payer: Self-pay | Admitting: Internal Medicine

## 2018-08-14 ENCOUNTER — Other Ambulatory Visit: Payer: Self-pay | Admitting: Internal Medicine

## 2018-08-15 ENCOUNTER — Encounter: Payer: Self-pay | Admitting: Internal Medicine

## 2018-08-15 ENCOUNTER — Ambulatory Visit (INDEPENDENT_AMBULATORY_CARE_PROVIDER_SITE_OTHER): Payer: 59 | Admitting: Internal Medicine

## 2018-08-15 VITALS — BP 104/70 | HR 94 | Temp 97.6°F | Resp 10 | Ht 64.0 in | Wt 149.0 lb

## 2018-08-15 DIAGNOSIS — B360 Pityriasis versicolor: Secondary | ICD-10-CM

## 2018-08-15 DIAGNOSIS — E663 Overweight: Secondary | ICD-10-CM

## 2018-08-15 DIAGNOSIS — M26609 Unspecified temporomandibular joint disorder, unspecified side: Secondary | ICD-10-CM

## 2018-08-15 MED ORDER — PHENTERMINE-TOPIRAMATE ER 15-92 MG PO CP24: 1.0000 | ORAL_CAPSULE | Freq: Every day | ORAL | 3 refills | Status: DC

## 2018-08-15 MED ORDER — FLUCONAZOLE 150 MG PO TABS: ORAL_TABLET | ORAL | 0 refills | Status: DC

## 2018-08-15 NOTE — Progress Notes (Signed)
Patient ID: ROSAN CALBERT, female   DOB: 1966-03-30, 52 y.o.   MRN: [id number]   [facility] OFFICE  Provider: DR Arletha Grippe  Code Status: FULL CODE Goals of Care:  Advanced Directives 11/29/2017  Does Patient Have a Medical Advance Directive? No  Would patient like information on creating a medical advance directive? -     Chief Complaint  Patient presents with  . Acute Visit    Rash on navel and right shoulder x 1 month. Patient tried Benadryl Cream. Follow-up on right ear concerns.  Marland Kitchen FYI    Diagnosed with TMJ by ENT  . Immunizations    Discuss need for TDaP   . Medication Refill    Phentermine-Topiramate    HPI: Patient is a 52 y.o. female seen today for an acute visit for unknown duration white spots on neck, back and in last 2 weeks abdomen. Not itchy. No d/c and no redness. No change in detergents, soaps or lotions. No insect bites. She does sweat a lot due to hot flashes.   Overweight - improving; BMI 25.58. She tried keto diet in June 2019. Maintains healthy food choices. Weight down 3 lbs since July 2019.  She saw ENT for ear pain. Wax removed but she continued to have discomfort. She was dx with right TMJ and takes ibuprofen prn which helps.  Past Medical History:  Diagnosis Date  . Allergy   . Anemia   . GERD (gastroesophageal reflux disease)   . High blood pressure   . Leg pain   . Numbness   . Perimenopause     Past Surgical History:  Procedure Laterality Date  . BREAST EXCISIONAL BIOPSY Left    30 years ago; benign  . Cyst removed from Left Breast  1984  . DILATION AND CURETTAGE OF UTERUS     Uterine abalation  . TUBAL LIGATION    . WISDOM TOOTH EXTRACTION       reports that she has never smoked. She has never used smokeless tobacco. She reports that she drinks about 6.0 standard drinks of alcohol per week. She reports that she does not use drugs. Social History   Socioeconomic History  . Marital status: Married    Spouse name: Not on file  .  Number of children: 1  . Years of education: BS  . Highest education level: Not on file  Occupational History  . 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 Financial resource strain: Not on file  . Food insecurity:    Worry: Not on file    Inability: Not on file  . Transportation needs:    Medical: Not on file    Non-medical: Not on file  Tobacco Use  . Smoking status: Never Smoker  . Smokeless tobacco: Never Used  Substance and Sexual Activity  . Alcohol use: Yes    Alcohol/week: 6.0 standard drinks    Types: 6 Standard drinks or equivalent per week    Comment: less than 5 times per week ( beer, wine and hard liquor)  . Drug use: No  . Sexual activity: Not Currently  Lifestyle  . Physical activity:    Days per week: Not on file    Minutes per session: Not on file  . Stress: Not on file  Relationships  . Social connections:    Talks on phone: Not on file    Gets together: Not on file    Attends  religious service: Not on file    Active member of club or organization: Not on file    Attends meetings of clubs or organizations: Not on file    Relationship status: Not on file  . Intimate partner violence:    Fear of current or ex partner: Not on file    Emotionally abused: Not on file    Physically abused: Not on file    Forced sexual activity: Not on file  Other Topics Concern  . Not on file  Social History Narrative   Patient is married with one child.   Patient is right handed.   Patient has a BS degree.   Patient drinks 1 8 oz caffeine drink daily at the most.            Diet: depends daily   Do you drink/eat things with caffeine? Yes   Marital status: Married                             What year were you married? 2012   Do you live in a house, apartment, assisted living, condo, trailer, etc)?  House   Is it one or more stories? 2 stories   How many persons live in your home? 3   Do you have any pets in your home?  No pets   Current or past profession: Psychologist, prison and probation services   Do you exercise?     sometimes                                                Type & how often: Irregular   Do you have a living will? No   Do you have a DNR Form? No   Do you have a POA/HPOA forms? No    Family History  Problem Relation Age of Onset  . Cancer Father        lung cancer  . Hypertension Father   . Hypertension Mother   . Arthritis Mother   . Cancer Maternal Grandmother   . COPD Paternal 33   . Alzheimer's disease Paternal Grandmother   . Colon cancer Neg Hx   . Esophageal cancer Neg Hx   . Liver cancer Neg Hx   . Pancreatic cancer Neg Hx   . Rectal cancer Neg Hx   . Stomach cancer Neg Hx     Allergies  Allergen Reactions  . Penicillin G Potassium In D5w     hives  . Sulfa Antibiotics     hives    Outpatient Encounter Medications as of 08/15/2018  Medication Sig  . cetirizine (ZYRTEC) 10 MG tablet Take 10 mg by mouth daily.  . Cholecalciferol (VITAMIN D3) 2000 units TABS Take 2,000 Units by mouth daily.  . fluticasone (FLONASE) 50 MCG/ACT nasal spray SHAKE LIQUID AND USE 1 SPRAY IN EACH NOSTRIL DAILY  . gabapentin (NEURONTIN) 300 MG capsule Take 1 capsule (300 mg total) by mouth 3 (three) times daily.  Marland Kitchen ibuprofen (ADVIL,MOTRIN) 600 MG tablet Take 600 mg by mouth 3 (three) times daily as needed (FOr TMJ Treatment).  Marland Kitchen ibuprofen (ADVIL,MOTRIN) 800 MG tablet TAKE 1 TABLET(800 MG) BY MOUTH DAILY AS NEEDED FOR MILD PAIN OR MODERATE PAIN  . montelukast (SINGULAIR) 10 MG tablet Take 10 mg by mouth at bedtime.  Marland Kitchen olmesartan-hydrochlorothiazide (BENICAR HCT)  40-25 MG tablet TAKE ONE TABLET BY MOUTH ONCE DAILY  . OVER THE COUNTER MEDICATION Vitamin C, One tablet daily.  Marland Kitchen OVER THE COUNTER MEDICATION Goody's Powder Extra Strenght. One packet PRN as needed for headache.  . Phentermine-Topiramate (QSYMIA) 15-92 MG CP24 Take 1 tablet by mouth daily.  Marland Kitchen zolpidem (AMBIEN) 5 MG tablet Take 1 tablet (5 mg total) by  mouth at bedtime as needed for sleep.   Facility-Administered Encounter Medications as of 08/15/2018  Medication  . 0.9 %  sodium chloride infusion    Review of Systems:  Review of Systems  Skin: Positive for rash.  All other systems reviewed and are negative.   Health Maintenance  Topic Date Due  . TETANUS/TDAP  06/12/1986  . PAP SMEAR  10/31/2017  . INFLUENZA VACCINE  10/25/2020 (Originally 05/25/2018)  . MAMMOGRAM  01/07/2019  . COLONOSCOPY  11/29/2022  . HIV Screening  Completed    Physical Exam: Vitals:   08/15/18 1038  BP: 104/70  Pulse: 94  Resp: 10  Temp: 97.6 F (36.4 C)  TempSrc: Oral  SpO2: 99%  Weight: 149 lb (67.6 kg)  Height: 5\' 4"  (1.626 m)   Body mass index is 25.58 kg/m. Physical Exam  Constitutional: She is oriented to person, place, and time. She appears well-developed and well-nourished.  Neurological: She is alert and oriented to person, place, and time.  Skin: Skin is warm, dry and intact. Rash noted. Rash is papular. She is not diaphoretic. No pallor.     Psychiatric: She has a normal mood and affect. Her behavior is normal. Judgment and thought content normal.    Labs reviewed: Basic Metabolic Panel: Recent Labs    09/29/17 0849 04/28/18 1000  NA 140 138  K 3.8 4.0  CL 103 100  CO2 27 27  GLUCOSE 88 78  BUN 21 17  CREATININE 0.71 0.58  CALCIUM 10.0 10.2  TSH 0.85  --    Liver Function Tests: Recent Labs    09/29/17 0849 04/28/18 1000  AST 18  --   ALT 18 30*  BILITOT 0.8  --   PROT 7.6  --    No results for input(s): LIPASE, AMYLASE in the last 8760 hours. No results for input(s): AMMONIA in the last 8760 hours. CBC: Recent Labs    09/29/17 0849  WBC 5.0  NEUTROABS 2,650  HGB 12.2  HCT 36.1  MCV 96.8  PLT 344   Lipid Panel: Recent Labs    09/29/17 0849  CHOL 203*  HDL 99  LDLCALC 93  TRIG 37  CHOLHDL 2.1   Lab Results  Component Value Date   HGBA1C 5.3 06/19/2014    Procedures since last  visit: No results found.  Assessment/Plan   ICD-10-CM   1. Tinea versicolor B36.0 fluconazole (DIFLUCAN) 150 MG tablet  2. Overweight (BMI 25.0-29.9) E66.3 Phentermine-Topiramate (QSYMIA) 15-92 MG CP24  3. 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     Charlotte Brafford S. Perlie Gold  [facility] [address] [place], [place] [address] [phone] Cell (Monday-Friday 8 AM - 5 PM) [phone] After 5 PM and follow prompts

## 2018-08-15 NOTE — Patient Instructions (Addendum)
START DIFLUCAN 150MG  TAKE 2 TABS EVERY WEEK X 2 WEEKS FOR RASH  Continue other medications as ordered  Follow up in 3 mos with new PCP, Wilfred Lacy, NP-C.    Tinea Versicolor Tinea versicolor is a skin infection that is caused by a type of yeast. It causes a rash that shows up as light or dark patches on the skin. It often occurs on the chest, back, neck, or upper arms. The condition usually does not cause other problems. In most cases, it goes away in a few weeks with treatment. The infection cannot be spread by person to another person. Follow these instructions at home:  Take medicines only as told by your doctor.  Scrub your skin every day with a dandruff shampoo as told by your doctor.  Do not scratch your skin in the rash area.  Avoid places that are hot and humid.  Do not use tanning booths.  Try to avoid sweating a lot. Contact a doctor if:  Your symptoms get worse.  You have a fever.  You have redness, swelling, or pain in the area of your rash.  You have fluid, blood, or pus coming from your rash.  Your rash comes back after treatment. This information is not intended to replace advice given to you by your health care provider. Make sure you discuss any questions you have with your health care provider. Document Released: 09/23/2008 Document Revised: 06/13/2016 Document Reviewed: 07/23/2014 Elsevier Interactive Patient Education  2018 [employer].

## 2018-08-22 ENCOUNTER — Telehealth: Payer: Self-pay

## 2018-08-22 NOTE — Telephone Encounter (Signed)
None of the alternatives are appropriate for her. Please call pt and ask if she is willing to pay out-of-pocket for med

## 2018-08-22 NOTE — Telephone Encounter (Signed)
Incoming fax received from [place], Qsymia is not covered by patient's plan. The preferred alternative is Benzphetamine Hcl, diethylpropion Hcl, or phentermine Hcl  Please advise

## 2018-08-23 NOTE — Telephone Encounter (Signed)
Called patient, no answer, and unable to leave message due to mailbox full  S.Chrae B/CMA

## 2018-08-24 NOTE — Telephone Encounter (Signed)
Tried calling patient. Unable to LM due to Mailbox full.

## 2018-08-25 NOTE — Telephone Encounter (Signed)
Called patient but there was no answer and voicemail was full.

## 2018-08-29 NOTE — Telephone Encounter (Signed)
Spoke with patient, patient already picked up rx and paid out of pocket for she knew it would not be covered and asked that [facility] not contact us about this yet they did.

## 2018-08-30 ENCOUNTER — Encounter: Payer: 59 | Admitting: Nurse Practitioner

## 2018-09-29 ENCOUNTER — Encounter: Payer: 59 | Admitting: Family

## 2018-09-29 ENCOUNTER — Encounter: Payer: 59 | Admitting: Internal Medicine

## 2018-10-11 ENCOUNTER — Encounter: Payer: Self-pay | Admitting: Nurse Practitioner

## 2018-10-11 ENCOUNTER — Ambulatory Visit (INDEPENDENT_AMBULATORY_CARE_PROVIDER_SITE_OTHER): Payer: 59 | Admitting: Nurse Practitioner

## 2018-10-11 VITALS — BP 100/70 | HR 95 | Temp 97.7°F | Ht 64.0 in | Wt 145.8 lb

## 2018-10-11 DIAGNOSIS — L309 Dermatitis, unspecified: Secondary | ICD-10-CM | POA: Diagnosis not present

## 2018-10-11 DIAGNOSIS — E663 Overweight: Secondary | ICD-10-CM

## 2018-10-11 DIAGNOSIS — I10 Essential (primary) hypertension: Secondary | ICD-10-CM

## 2018-10-11 DIAGNOSIS — Z0000 Encounter for general adult medical examination without abnormal findings: Secondary | ICD-10-CM

## 2018-10-11 MED ORDER — TRIAMCINOLONE ACETONIDE 0.1 % EX CREA: 1.0000 "application " | TOPICAL_CREAM | Freq: Two times a day (BID) | CUTANEOUS | 0 refills | Status: DC

## 2018-10-11 MED ORDER — TETANUS-DIPHTH-ACELL PERTUSSIS 5-2.5-18.5 LF-MCG/0.5 IM SUSP: 0.5000 mL | Freq: Once | INTRAMUSCULAR | 0 refills | Status: AC

## 2018-10-11 NOTE — Patient Instructions (Addendum)
To stop QSYMIA after the holidays but continue diet and exercise YOUR WEIGHT LOOKS GREAT! KEEP IT UP!!  To follow up next week for fasting blood work  Get These Tests  Blood Pressure- Have your blood pressure checked by your healthcare provider at least once a year.  Normal blood pressure is 120/80. Goal for most <140/90.  Weight- Have your body mass index (BMI) calculated to screen for obesity.  BMI is a measure of body fat based on height and weight.  You can calculate your own BMI at [employer]  Cholesterol- Have your cholesterol checked every year.  Diabetes- Have your blood sugar checked every year if you have high blood pressure, high cholesterol, a family history of diabetes or if you are overweight.  Pap Test - Have a pap test every 1 to 5 years if you have been sexually active.  If you are older than 65 and recent pap tests have been normal you may not need additional pap tests.  In addition, if you have had a hysterectomy  for benign disease additional pap tests are not necessary.  Mammogram-Yearly mammograms are essential for early detection of breast cancer  Screening for Colon Cancer- Colonoscopy starting at age 42. Screening may begin sooner depending on your family history and other health conditions.  Follow up colonoscopy as directed by your Gastroenterologist.  Screening for Osteoporosis- Screening begins at age 63 with bone density scanning, sooner if you are at higher risk for developing Osteoporosis.   Get these medicines  Calcium with Vitamin D- Your body requires 1200-1500 mg of Calcium a day and [phone] IU of Vitamin D a day.  You can only absorb 500 mg of Calcium at a time therefore Calcium must be taken in 2 or 3 separate doses throughout the day.  Hormones- Hormone therapy has been associated with increased risk for certain cancers and heart disease.  Talk to your healthcare provider about if you need relief from menopausal symptoms.  Aspirin-  Ask your healthcare provider about taking Aspirin to prevent Heart Disease and Stroke.   Get these Immuniztions  Flu shot- Every fall  Pneumonia shot- Once after the age of 64; if you are younger ask your healthcare provider if you need a pneumonia shot.  Tetanus- Every ten years.  Shingrix- Once after the age of 13 to prevent shingles.   Take these steps  Don't smoke- Your healthcare provider can help you quit. For tips on how to quit, ask your healthcare provider or go to www.smokefree.gov or call 1-800 QUIT-NOW.  Be physically active- Exercise 5 days a week for a minimum of 30 minutes.  If you are not already physically active, start slow and gradually work up to 30 minutes of moderate physical activity.  Try walking, dancing, bike riding, swimming, etc.  Eat a healthy diet- Eat a variety of healthy foods such as fruits, vegetables, whole grains, low fat milk, low fat cheeses, yogurt, lean meats, chicken, fish, eggs, dried beans, tofu, etc.  For more information go to www.thenutritionsource.org  Dental visit- Brush and floss teeth twice daily; visit your dentist twice a year.  Eye exam- Visit your Optometrist or Ophthalmologist yearly.  Drink alcohol in moderation- Limit alcohol intake to one drink or less a day.  Never drink and drive.  Depression- Your emotional health is as important as your physical health.  If you're feeling down or losing interest in things you normally enjoy, please talk to your healthcare provider.  Seat Belts- can save your  life; always wear one  Smoke/Carbon Monoxide detectors- These detectors need to be installed on the appropriate level of your home.  Replace batteries at least once a year.  Violence- If anyone is threatening or hurting you, please tell your healthcare provider.  Living Will/ Health care power of attorney- Discuss with your healthcare provider and family.   Health Maintenance, Female Adopting a healthy lifestyle and getting  preventive care can go a long way to promote health and wellness. Talk with your health care provider about what schedule of regular examinations is right for you. This is a good chance for you to check in with your provider about disease prevention and staying healthy. In between checkups, there are plenty of things you can do on your own. Experts have done a lot of research about which lifestyle changes and preventive measures are most likely to keep you healthy. Ask your health care provider for more information. Weight and diet Eat a healthy diet Be sure to include plenty of vegetables, fruits, low-fat dairy products, and lean protein. Do not eat a lot of foods high in solid fats, added sugars, or salt. Get regular exercise. This is one of the most important things you can do for your health. Most adults should exercise for at least 150 minutes each week. The exercise should increase your heart rate and make you sweat (moderate-intensity exercise). Most adults should also do strengthening exercises at least twice a week. This is in addition to the moderate-intensity exercise. Maintain a healthy weight Body mass index (BMI) is a measurement that can be used to identify possible weight problems. It estimates body fat based on height and weight. Your health care provider can help determine your BMI and help you achieve or maintain a healthy weight. For females 14 years of age and older: A BMI below 18.5 is considered underweight. A BMI of 18.5 to 24.9 is normal. A BMI of 25 to 29.9 is considered overweight. A BMI of 30 and above is considered obese. Watch levels of cholesterol and blood lipids You should start having your blood tested for lipids and cholesterol at 51 years of age, then have this test every 5 years. You may need to have your cholesterol levels checked more often if: Your lipid or cholesterol levels are high. You are older than 51 years of age. You are at high risk for heart  disease. Cancer screening Lung Cancer Lung cancer screening is recommended for adults 14-16 years old who are at high risk for lung cancer because of a history of smoking. A yearly low-dose CT scan of the lungs is recommended for people who: Currently smoke. Have quit within the past 15 years. Have at least a 30-pack-year history of smoking. A pack year is smoking an average of one pack of cigarettes a day for 1 year. Yearly screening should continue until it has been 15 years since you quit. Yearly screening should stop if you develop a health problem that would prevent you from having lung cancer treatment. Breast Cancer Practice breast self-awareness. This means understanding how your breasts normally appear and feel. It also means doing regular breast self-exams. Let your health care provider know about any changes, no matter how small. If you are in your 20s or 30s, you should have a clinical breast exam (CBE) by a health care provider every 1-3 years as part of a regular health exam. If you are 73 or older, have a CBE every year. Also consider having a breast  X-ray (mammogram) every year. If you have a family history of breast cancer, talk to your health care provider about genetic screening. If you are at high risk for breast cancer, talk to your health care provider about having an MRI and a mammogram every year. Breast cancer gene (BRCA) assessment is recommended for women who have family members with BRCA-related cancers. BRCA-related cancers include: Breast. Ovarian. Tubal. Peritoneal cancers. Results of the assessment will determine the need for genetic counseling and BRCA1 and BRCA2 testing. Cervical Cancer Your health care provider may recommend that you be screened regularly for cancer of the pelvic organs (ovaries, uterus, and vagina). This screening involves a pelvic examination, including checking for microscopic changes to the surface of your cervix (Pap test). You may be  encouraged to have this screening done every 3 years, beginning at age 23. For women ages 61-65, health care providers may recommend pelvic exams and Pap testing every 3 years, or they may recommend the Pap and pelvic exam, combined with testing for human papilloma virus (HPV), every 5 years. Some types of HPV increase your risk of cervical cancer. Testing for HPV may also be done on women of any age with unclear Pap test results. Other health care providers may not recommend any screening for nonpregnant women who are considered low risk for pelvic cancer and who do not have symptoms. Ask your health care provider if a screening pelvic exam is right for you. If you have had past treatment for cervical cancer or a condition that could lead to cancer, you need Pap tests and screening for cancer for at least 20 years after your treatment. If Pap tests have been discontinued, your risk factors (such as having a new sexual partner) need to be reassessed to determine if screening should resume. Some women have medical problems that increase the chance of getting cervical cancer. In these cases, your health care provider may recommend more frequent screening and Pap tests. Colorectal Cancer This type of cancer can be detected and often prevented. Routine colorectal cancer screening usually begins at 51 years of age and continues through 51 years of age. Your health care provider may recommend screening at an earlier age if you have risk factors for colon cancer. Your health care provider may also recommend using home test kits to check for hidden blood in the stool. A small camera at the end of a tube can be used to examine your colon directly (sigmoidoscopy or colonoscopy). This is done to check for the earliest forms of colorectal cancer. Routine screening usually begins at age 37. Direct examination of the colon should be repeated every 5-10 years through 51 years of age. However, you may need to be screened  more often if early forms of precancerous polyps or small growths are found. Skin Cancer Check your skin from head to toe regularly. Tell your health care provider about any new moles or changes in moles, especially if there is a change in a mole's shape or color. Also tell your health care provider if you have a mole that is larger than the size of a pencil eraser. Always use sunscreen. Apply sunscreen liberally and repeatedly throughout the day. Protect yourself by wearing long sleeves, pants, a wide-brimmed hat, and sunglasses whenever you are outside. Heart disease, diabetes, and high blood pressure High blood pressure causes heart disease and increases the risk of stroke. High blood pressure is more likely to develop in: People who have blood pressure in the high end  of the normal range (130-139/85-89 mm Hg). People who are overweight or obese. People who are African American. If you are 72-18 years of age, have your blood pressure checked every 3-5 years. If you are 51 years of age or older, have your blood pressure checked every year. You should have your blood pressure measured twice-once when you are at a hospital or clinic, and once when you are not at a hospital or clinic. Record the average of the two measurements. To check your blood pressure when you are not at a hospital or clinic, you can use: An automated blood pressure machine at a pharmacy. A home blood pressure monitor. If you are between 58 years and 74 years old, ask your health care provider if you should take aspirin to prevent strokes. Have regular diabetes screenings. This involves taking a blood sample to check your fasting blood sugar level. If you are at a normal weight and have a low risk for diabetes, have this test once every three years after 51 years of age. If you are overweight and have a high risk for diabetes, consider being tested at a younger age or more often. Preventing infection Hepatitis B If you have  a higher risk for hepatitis B, you should be screened for this virus. You are considered at high risk for hepatitis B if: You were born in a country where hepatitis B is common. Ask your health care provider which countries are considered high risk. Your parents were born in a high-risk country, and you have not been immunized against hepatitis B (hepatitis B vaccine). You have HIV or AIDS. You use needles to inject street drugs. You live with someone who has hepatitis B. You have had sex with someone who has hepatitis B. You get hemodialysis treatment. You take certain medicines for conditions, including cancer, organ transplantation, and autoimmune conditions. Hepatitis C Blood testing is recommended for: Everyone born from [birth year] through 1964. Anyone with known risk factors for hepatitis C. Sexually transmitted infections (STIs) You should be screened for sexually transmitted infections (STIs) including gonorrhea and chlamydia if: You are sexually active and are younger than 51 years of age. You are older than 51 years of age and your health care provider tells you that you are at risk for this type of infection. Your sexual activity has changed since you were last screened and you are at an increased risk for chlamydia or gonorrhea. Ask your health care provider if you are at risk. If you do not have HIV, but are at risk, it may be recommended that you take a prescription medicine daily to prevent HIV infection. This is called pre-exposure prophylaxis (PrEP). You are considered at risk if: You are sexually active and do not regularly use condoms or know the HIV status of your partner(s). You take drugs by injection. You are sexually active with a partner who has HIV. Talk with your health care provider about whether you are at high risk of being infected with HIV. If you choose to begin PrEP, you should first be tested for HIV. You should then be tested every 3 months for as long as you are  taking PrEP. Pregnancy If you are premenopausal and you may become pregnant, ask your health care provider about preconception counseling. If you may become pregnant, take 400 to 800 micrograms (mcg) of folic acid every day. If you want to prevent pregnancy, talk to your health care provider about birth control (contraception). Osteoporosis and menopause Osteoporosis is  a disease in which the bones lose minerals and strength with aging. This can result in serious bone fractures. Your risk for osteoporosis can be identified using a bone density scan. If you are 72 years of age or older, or if you are at risk for osteoporosis and fractures, ask your health care provider if you should be screened. Ask your health care provider whether you should take a calcium or vitamin D supplement to lower your risk for osteoporosis. Menopause may have certain physical symptoms and risks. Hormone replacement therapy may reduce some of these symptoms and risks. Talk to your health care provider about whether hormone replacement therapy is right for you. Follow these instructions at home: Schedule regular health, dental, and eye exams. Stay current with your immunizations. Do not use any tobacco products including cigarettes, chewing tobacco, or electronic cigarettes. If you are pregnant, do not drink alcohol. If you are breastfeeding, limit how much and how often you drink alcohol. Limit alcohol intake to no more than 1 drink per day for nonpregnant women. One drink equals 12 ounces of beer, 5 ounces of wine, or 1 ounces of hard liquor. Do not use street drugs. Do not share needles. Ask your health care provider for help if you need support or information about quitting drugs. Tell your health care provider if you often feel depressed. Tell your health care provider if you have ever been abused or do not feel safe at home. This information is not intended to replace advice given to you by your health care  provider. Make sure you discuss any questions you have with your health care provider. Document Released: 04/26/2011 Document Revised: 03/18/2016 Document Reviewed: 07/15/2015 Elsevier Interactive Patient Education  2019 [employer].

## 2018-10-11 NOTE — Progress Notes (Signed)
ek 

## 2018-10-11 NOTE — Progress Notes (Signed)
Provider: Gildardo Cranker, DO  Patient Care Team: Gildardo Cranker, DO as PCP - General (Internal Medicine)  Extended Emergency Contact Information Primary Emergency Contact: [facility] Address: [address]          [place], [place] [address] Johnnette Litter of [place] Phone: [phone] Mobile Phone: [phone] Relation: Spouse Allergies  Allergen Reactions  . Penicillin G Potassium In D5w     hives  . Sulfa Antibiotics     hives   Code Status: FULL Goals of Care: Advanced Directive information Advanced Directives 10/11/2018  Does Patient Have a Medical Advance Directive? No  Would patient like information on creating a medical advance directive? No - Patient declined     Chief Complaint  Patient presents with  . Annual Exam    Physical, EKG, patient says she needs rash looked at   . Health Maintenance    Patient stated she has gynecologist and was not due for pap smear    HPI: Patient is a 52 y.o. female seen in today for an annual wellness exam.   Goes yearly to GYN for PAP and breast exam/mammogram.  Reports dense breast tissue therefore goes to breast center/ Does not get flu shot.   Depression screen [facility] 2/9 10/05/2017 10/20/2016 10/24/2015  Decreased Interest 0 0 0  Down, Depressed, Hopeless 0 0 0  PHQ - 2 Score 0 0 0    Fall Risk  08/15/2018 04/28/2018 11/07/2017 10/05/2017 03/25/2017  Falls in the past year? No No No No No   MMSE - Mini Mental State Exam 12/27/2016  Orientation to time 5  Orientation to Place 5  Registration 3  Attention/ Calculation 5  Recall 2  Language- name 2 objects 2  Language- repeat 1  Language- follow 3 step command 2  Language- read & follow direction 1  Write a sentence 1  Copy design 1  Total score 28     Health Maintenance  Topic Date Due  . TETANUS/TDAP  06/12/1986  . PAP SMEAR-Modifier  10/31/2017  . INFLUENZA VACCINE  10/25/2020 (Originally 05/25/2018)  . MAMMOGRAM  01/07/2019  . COLONOSCOPY   11/29/2022  . HIV Screening  Completed   Diet? Has attempted diet modifications. Currently on low cholesterol diet. On phentermine-topiramate which has helped with weight loss.  Physical Activity?  Was walking but not recently.   Dentition: dentist every 6 months   Ophthalmology: every year.   Past Medical History:  Diagnosis Date  . Allergy   . Anemia   . GERD (gastroesophageal reflux disease)   . High blood pressure   . Leg pain   . Numbness   . Perimenopause     Past Surgical History:  Procedure Laterality Date  . BREAST EXCISIONAL BIOPSY Left    30 years ago; benign  . Cyst removed from Left Breast  1984  . DILATION AND CURETTAGE OF UTERUS     Uterine abalation  . TUBAL LIGATION    . WISDOM TOOTH EXTRACTION      Social History   Socioeconomic History  . Marital status: Married    Spouse name: Not on file  . Number of children: 1  . Years of education: BS  . Highest education level: Not on file  Occupational History  . 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 Financial resource strain: Not on file  . Food insecurity:    Worry: Not on file    Inability:  Not on file  . Transportation needs:    Medical: Not on file    Non-medical: Not on file  Tobacco Use  . Smoking status: Never Smoker  . Smokeless tobacco: Never Used  Substance and Sexual Activity  . Alcohol use: Yes    Alcohol/week: 6.0 standard drinks    Types: 6 Standard drinks or equivalent per week    Comment: less than 5 times per week ( beer, wine and hard liquor)  . Drug use: No  . Sexual activity: Not Currently  Lifestyle  . Physical activity:    Days per week: Not on file    Minutes per session: Not on file  . Stress: Not on file  Relationships  . Social connections:    Talks on phone: Not on file    Gets together: Not on file    Attends religious service: Not on file    Active member of club or organization: Not on file    Attends  meetings of clubs or organizations: Not on file    Relationship status: Not on file  Other Topics Concern  . Not on file  Social History Narrative   Patient is married with one child.   Patient is right handed.   Patient has a BS degree.   Patient drinks 1 8 oz caffeine drink daily at the most.            Diet: depends daily   Do you drink/eat things with caffeine? Yes   Marital status: Married                             What year were you married? 2012   Do you live in a house, apartment, assisted living, condo, trailer, etc)?  House   Is it one or more stories? 2 stories   How many persons live in your home? 3   Do you have any pets in your home? No pets   Current or past profession: Psychologist, prison and probation services   Do you exercise?     sometimes                                                Type & how often: Irregular   Do you have a living will? No   Do you have a DNR Form? No   Do you have a POA/HPOA forms? No    Family History  Problem Relation Age of Onset  . Cancer Father        lung cancer  . Hypertension Father   . Hypertension Mother   . Arthritis Mother   . Cancer Maternal Grandmother   . COPD Paternal 68   . Alzheimer's disease Paternal Grandmother   . Colon cancer Neg Hx   . Esophageal cancer Neg Hx   . Liver cancer Neg Hx   . Pancreatic cancer Neg Hx   . Rectal cancer Neg Hx   . Stomach cancer Neg Hx     Review of Systems:  Review of Systems  Constitutional: Negative for activity change, appetite change, fatigue and unexpected weight change.  HENT: Negative for congestion and hearing loss.   Eyes: Negative.   Respiratory: Negative for cough and shortness of breath.   Cardiovascular: Negative for chest pain, palpitations and leg swelling.  Gastrointestinal: Negative for  abdominal pain, constipation and diarrhea.  Genitourinary: Negative for difficulty urinating and dysuria.  Musculoskeletal: Negative for arthralgias and myalgias.  Skin: Positive for rash.  Negative for color change and wound.  Neurological: Negative for dizziness and weakness.  Psychiatric/Behavioral: Negative for agitation, behavioral problems and confusion.     Allergies as of 10/11/2018      Reactions   Penicillin G Potassium In D5w    hives   Sulfa Antibiotics    hives      Medication List       Accurate as of October 11, 2018 11:00 AM. Always use your most recent med list.        cetirizine 10 MG tablet Commonly known as:  ZYRTEC Take 10 mg by mouth daily.   fluticasone 50 MCG/ACT nasal spray Commonly known as:  FLONASE SHAKE LIQUID AND USE 1 SPRAY IN EACH NOSTRIL DAILY   gabapentin 300 MG capsule Commonly known as:  NEURONTIN Take 1 capsule (300 mg total) by mouth 3 (three) times daily.   ibuprofen 600 MG tablet Commonly known as:  ADVIL,MOTRIN Take 600 mg by mouth 3 (three) times daily as needed (FOr TMJ Treatment).   ibuprofen 800 MG tablet Commonly known as:  ADVIL,MOTRIN TAKE 1 TABLET(800 MG) BY MOUTH DAILY AS NEEDED FOR MILD PAIN OR MODERATE PAIN   montelukast 10 MG tablet Commonly known as:  SINGULAIR Take 10 mg by mouth at bedtime.   olmesartan-hydrochlorothiazide 40-25 MG tablet Commonly known as:  BENICAR HCT TAKE ONE TABLET BY MOUTH ONCE DAILY   OVER THE COUNTER MEDICATION Vitamin C, One tablet daily.   OVER THE COUNTER MEDICATION Goody's Powder Extra Strenght. One packet PRN as needed for headache.   Phentermine-Topiramate 15-92 MG Cp24 Commonly known as:  QSYMIA Take 1 tablet by mouth daily.   Vitamin D3 50 MCG (2000 UT) Tabs Take 2,000 Units by mouth daily.   zolpidem 5 MG tablet Commonly known as:  AMBIEN Take 1 tablet (5 mg total) by mouth at bedtime as needed for sleep.         Physical Exam: Vitals:   10/11/18 1042  BP: 100/70  Pulse: 95  Temp: 97.7 F (36.5 C)  TempSrc: Oral  SpO2: 98%  Weight: 145 lb 12.8 oz (66.1 kg)  Height: 5\' 4"  (1.626 m)   Body mass index is 25.03 kg/m. Physical  Exam Vitals signs reviewed.  Constitutional:      General: She is not in acute distress.    Appearance: She is well-developed.  HENT:     Head: Normocephalic and atraumatic.     Right Ear: External ear normal.     Left Ear: External ear normal.     Mouth/Throat:     Pharynx: No oropharyngeal exudate.  Eyes:     General: No scleral icterus.    Pupils: Pupils are equal, round, and reactive to light.  Neck:     Musculoskeletal: Normal range of motion and neck supple.     Thyroid: No thyromegaly.     Vascular: No carotid bruit.     Trachea: No tracheal deviation.  Cardiovascular:     Rate and Rhythm: Normal rate and regular rhythm.     Heart sounds: No murmur. No friction rub. No gallop.      Comments: No LE edema b/l. No calf TTP Pulmonary:     Effort: Pulmonary effort is normal. No respiratory distress.     Breath sounds: Normal breath sounds. No wheezing or rales.  Chest:  Chest wall: No tenderness.     Breasts: Breasts are symmetrical.        Right: No inverted nipple, mass, nipple discharge, skin change or tenderness.        Left: No inverted nipple, mass, nipple discharge, skin change or tenderness.  Abdominal:     General: Bowel sounds are normal. There is no distension.     Palpations: Abdomen is soft. There is no hepatomegaly or mass.     Tenderness: There is no abdominal tenderness. There is no guarding or rebound.     Hernia: No hernia is present.  Genitourinary:    Comments: Deferred to GYN Musculoskeletal:        General: No deformity.  Lymphadenopathy:     Cervical: No cervical adenopathy.  Skin:    General: Skin is warm and dry.     Findings: Rash present.  Neurological:     Mental Status: She is alert and oriented to person, place, and time.     Deep Tendon Reflexes: Reflexes are normal and symmetric.  Psychiatric:        Behavior: Behavior normal.        Judgment: Judgment normal.    Labs reviewed: Basic Metabolic Panel: Recent Labs     04/28/18 1000  NA 138  K 4.0  CL 100  CO2 27  GLUCOSE 78  BUN 17  CREATININE 0.58  CALCIUM 10.2   Liver Function Tests: Recent Labs    04/28/18 1000  ALT 30*   No results for input(s): LIPASE, AMYLASE in the last 8760 hours. No results for input(s): AMMONIA in the last 8760 hours. CBC: No results for input(s): WBC, NEUTROABS, HGB, HCT, MCV, PLT in the last 8760 hours. Lipid Panel: No results for input(s): CHOL, HDL, LDLCALC, TRIG, CHOLHDL, LDLDIRECT in the last 8760 hours. Lab Results  Component Value Date   HGBA1C 5.3 06/19/2014    Procedures: No results found.  Assessment/Plan 1. Benign essential HTN -well controlled, continues on benicar hctz - EKG 12-Lead -SR rate 93  2. Overweight (BMI 25.0-29.9) BMI continues to improve, encouraged to continue good healthy food choices and to increase exercise. Pt has been on Phentermine-topiramate since Feb 2017. Will continue current Rx which will last her until the end of the month and then will stop but to maintain healthy lifestyle   3. Well adult exam -The patient was counseled regarding the appropriate use of alcohol, regular self-examination of the breasts on a monthly basis, prevention of dental and periodontal disease, diet, regular sustained exercise for at least 30 minutes 5 times per week,*routine screening interval for mammogram as recommended by the [place] and ACOG, importance of regular PAP smears, GI hemoccult testing, colonoscopy, cholesterol, thyroid and diabetes screening. -following with GYN for PAP and mammogram, UTD on colonoscopy, dental and ophthalmologist - Lipid Panel; Future - CMP; Future - CBC with Differential/Platelets; Future  4. Dermatitis -treated for tinea versicolor with diflucan however without improvement. Area on abdomen raised and itching. - triamcinolone cream (KENALOG) 0.1 %; Apply 1 application topically 2 (two) times daily.  Dispense: 30 g; Refill: 0 - Ambulatory  referral to Dermatology if no improvement with triamcinolone cream  Next appt: 6 months, sooner if needed  Jessica K. [place], [facility] [phone]

## 2018-10-12 ENCOUNTER — Other Ambulatory Visit: Payer: Self-pay | Admitting: *Deleted

## 2018-10-12 MED ORDER — OLMESARTAN MEDOXOMIL-HCTZ 40-25 MG PO TABS: 1.0000 | ORAL_TABLET | Freq: Every day | ORAL | 1 refills | Status: DC

## 2018-10-12 NOTE — Telephone Encounter (Signed)
[facility]

## 2018-10-20 ENCOUNTER — Other Ambulatory Visit: Payer: 59

## 2018-10-20 DIAGNOSIS — Z0000 Encounter for general adult medical examination without abnormal findings: Secondary | ICD-10-CM

## 2018-10-20 LAB — CBC WITH DIFFERENTIAL/PLATELET
Absolute Monocytes: 468 cells/uL (ref 200–950)
BASOS PCT: 0.2 %
Basophils Absolute: 9 cells/uL (ref 0–200)
EOS PCT: 0.4 %
Eosinophils Absolute: 18 cells/uL (ref 15–500)
HEMATOCRIT: 36.4 % (ref 35.0–45.0)
HEMOGLOBIN: 12.6 g/dL (ref 11.7–15.5)
LYMPHS ABS: 1755 {cells}/uL (ref 850–3900)
MCH: 33.8 pg — ABNORMAL HIGH (ref 27.0–33.0)
MCHC: 34.6 g/dL (ref 32.0–36.0)
MCV: 97.6 fL (ref 80.0–100.0)
MPV: 9.9 fL (ref 7.5–12.5)
Monocytes Relative: 10.4 %
Neutro Abs: 2250 cells/uL (ref 1500–7800)
Neutrophils Relative %: 50 %
Platelets: 282 10*3/uL (ref 140–400)
RBC: 3.73 10*6/uL — ABNORMAL LOW (ref 3.80–5.10)
RDW: 12.1 % (ref 11.0–15.0)
Total Lymphocyte: 39 %
WBC: 4.5 10*3/uL (ref 3.8–10.8)

## 2018-10-20 LAB — COMPREHENSIVE METABOLIC PANEL
AG Ratio: 1.6 (calc) (ref 1.0–2.5)
ALBUMIN MSPROF: 4.7 g/dL (ref 3.6–5.1)
ALT: 16 U/L (ref 6–29)
AST: 19 U/L (ref 10–35)
Alkaline phosphatase (APISO): 57 U/L (ref 33–130)
BILIRUBIN TOTAL: 0.9 mg/dL (ref 0.2–1.2)
BUN: 20 mg/dL (ref 7–25)
CALCIUM: 10.2 mg/dL (ref 8.6–10.4)
CO2: 26 mmol/L (ref 20–32)
Chloride: 104 mmol/L (ref 98–110)
Creat: 0.65 mg/dL (ref 0.50–1.05)
Globulin: 3 g/dL (calc) (ref 1.9–3.7)
Glucose, Bld: 86 mg/dL (ref 65–99)
POTASSIUM: 3.7 mmol/L (ref 3.5–5.3)
Sodium: 141 mmol/L (ref 135–146)
TOTAL PROTEIN: 7.7 g/dL (ref 6.1–8.1)

## 2018-10-20 LAB — LIPID PANEL
Cholesterol: 223 mg/dL — ABNORMAL HIGH (ref <200)
HDL: 101 mg/dL (ref >50)
LDL CHOLESTEROL (CALC): 108 mg/dL — AB
Non-HDL Cholesterol (Calc): 122 mg/dL (calc) (ref <130)
Total CHOL/HDL Ratio: 2.2 (calc) (ref <5.0)
Triglycerides: 47 mg/dL (ref <150)

## 2018-12-01 ENCOUNTER — Other Ambulatory Visit: Payer: Self-pay

## 2019-02-18 ENCOUNTER — Encounter: Payer: Self-pay | Admitting: Nurse Practitioner

## 2019-03-05 IMAGING — XA Imaging study
1 series · 1 of 1 positions shown · non-contrast
Comparison: none

CLINICAL DATA: Lumbosacral radiculopathy. Recurrent left lower
extremity radiculitis.

[Series 1: ortho standard · 1 of 1 slices shown]
[im 1/1]
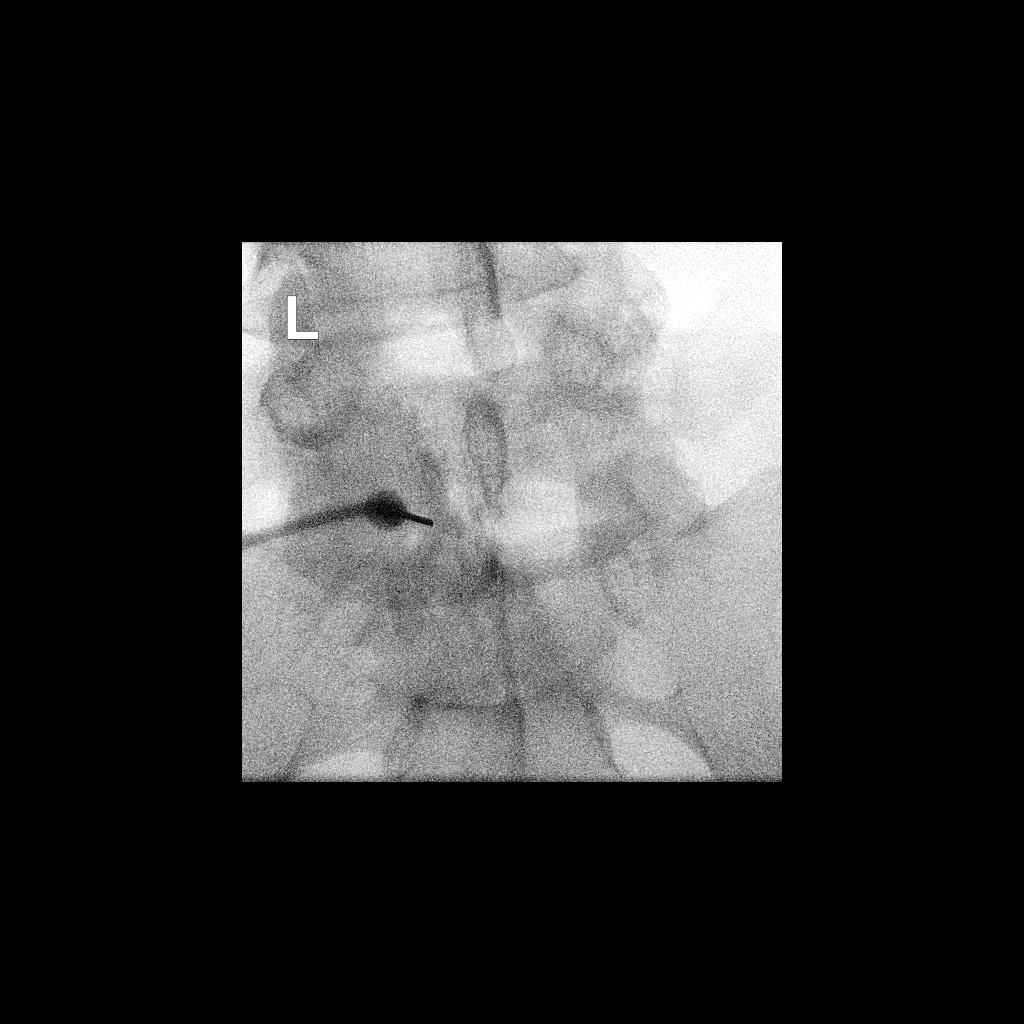

[1 of 1 positions shown; findings below may reference images not displayed]

FLUOROSCOPY TIME:  Radiation Exposure Index (as provided by the
fluoroscopic device): 3.77 uGy*m2

Fluoroscopy Time:  8 seconds

Number of Acquired Images:  0

PROCEDURE:
The procedure, risks, benefits, and alternatives were explained to
the patient. Questions regarding the procedure were encouraged and
answered. The patient understands and consents to the procedure.

LUMBAR EPIDURAL INJECTION:

An interlaminar approach was performed on left at L5-S1 level. The
overlying skin was cleansed and anesthetized. A 20 gauge epidural
needle was advanced using loss-of-resistance technique.

DIAGNOSTIC EPIDURAL INJECTION:

Injection of Isovue-M 200 shows a good epidural pattern with spread
above and below the level of needle placement, primarily on the left
no vascular opacification is seen.

THERAPEUTIC EPIDURAL INJECTION:

120 mg of Depo-Medrol mixed with 3 mL 1% lidocaine were instilled.
The procedure was well-tolerated, and the patient was discharged
thirty minutes following the injection in good condition.

COMPLICATIONS:
None
IMPRESSION: Technically successful epidural injection on the left L5-S1 # 1

## 2019-04-18 ENCOUNTER — Encounter: Payer: Self-pay | Admitting: Nurse Practitioner

## 2019-04-18 ENCOUNTER — Other Ambulatory Visit: Payer: Self-pay

## 2019-04-18 ENCOUNTER — Ambulatory Visit (INDEPENDENT_AMBULATORY_CARE_PROVIDER_SITE_OTHER): Payer: 59 | Admitting: Nurse Practitioner

## 2019-04-18 VITALS — BP 118/80 | HR 78 | Temp 98.0°F | Ht 64.0 in | Wt 163.0 lb

## 2019-04-18 DIAGNOSIS — L900 Lichen sclerosus et atrophicus: Secondary | ICD-10-CM

## 2019-04-18 DIAGNOSIS — R638 Other symptoms and signs concerning food and fluid intake: Secondary | ICD-10-CM

## 2019-04-18 DIAGNOSIS — J301 Allergic rhinitis due to pollen: Secondary | ICD-10-CM

## 2019-04-18 DIAGNOSIS — Z6827 Body mass index (BMI) 27.0-27.9, adult: Secondary | ICD-10-CM

## 2019-04-18 DIAGNOSIS — M419 Scoliosis, unspecified: Secondary | ICD-10-CM | POA: Diagnosis not present

## 2019-04-18 DIAGNOSIS — E785 Hyperlipidemia, unspecified: Secondary | ICD-10-CM

## 2019-04-18 DIAGNOSIS — I10 Essential (primary) hypertension: Secondary | ICD-10-CM | POA: Diagnosis not present

## 2019-04-18 DIAGNOSIS — M5417 Radiculopathy, lumbosacral region: Secondary | ICD-10-CM

## 2019-04-18 MED ORDER — IBUPROFEN 800 MG PO TABS: ORAL_TABLET | ORAL | 1 refills | Status: AC

## 2019-04-18 NOTE — Progress Notes (Signed)
Careteam: Patient Care Team: Lauree Chandler, NP as PCP - General (Geriatric Medicine) Arlyss Gandy, PA-C (Dermatology) Loney Loh, MD (Dermatology)  Advanced Directive information Does Patient Have a Medical Advance Directive?: No, Would patient like information on creating a medical advance directive?: Yes (MAU/Ambulatory/Procedural Areas - Information given)(Paperwork given at previous visit)  Allergies  Allergen Reactions  . Penicillin G Potassium In D5w     hives  . Sulfa Antibiotics     hives    Chief Complaint  Patient presents with  . Medical Management of Chronic Issues    6 month follow-up   . Medication Refill    Refill Ibuprofen 800 mg at [facility]      HPI: Patient is a 53 y.o. female seen in the office today for routine follow up.  Follow with dermatologist for Lichen sclerosus- planning to see a more specialized dermatologist in the next month  Chronic back pain/OA/Headache- Use ibuprofen for joint pain and headache- uses randomly does not need daily. Uses gabapentin for back pain when it flares, neurologist prescribed.   Seasonal allergies- controlled, uses zyrtec daily but uses Singulair as needed when allergies get bad. Also uses Flonase as needed  htn- controled. olmesartan-hctz daily for high blood pressure, does not follow low sodium diet.  Has gotten away from diet and exercise.   Insomnia- uses Ambien 6.25 mg as needed for sleep. Uses 10-15 days a month. Helps sleep due to menopausal symptoms.  Prescribed by GYN  Getting PAP and mammogram at GYN  Review of Systems:  Review of Systems  Constitutional: Negative for chills, fever and weight loss.  HENT: Negative for tinnitus.   Respiratory: Negative for cough, sputum production and shortness of breath.   Cardiovascular: Negative for chest pain, palpitations and leg swelling.  Gastrointestinal: Negative for abdominal pain, constipation, diarrhea and heartburn.  Genitourinary:  Negative for dysuria, frequency and urgency.  Musculoskeletal: Positive for back pain and joint pain. Negative for falls and myalgias.  Neurological: Negative for dizziness and headaches.  Psychiatric/Behavioral: Negative for depression and memory loss. The patient has insomnia (due to menapause symptoms).     Past Medical History:  Diagnosis Date  . Allergy   . Anemia   . GERD (gastroesophageal reflux disease)   . High blood pressure   . Leg pain   . Numbness   . Perimenopause    Past Surgical History:  Procedure Laterality Date  . BREAST EXCISIONAL BIOPSY Left    30 years ago; benign  . Cyst removed from Left Breast  1984  . DILATION AND CURETTAGE OF UTERUS     Uterine abalation  . TUBAL LIGATION    . WISDOM TOOTH EXTRACTION     Social History:   reports that she has never smoked. She has never used smokeless tobacco. She reports current alcohol use of about 6.0 standard drinks of alcohol per week. She reports that she does not use drugs.  Family History  Problem Relation Age of Onset  . Cancer Father        lung cancer  . Hypertension Father   . Hypertension Mother   . Arthritis Mother   . Cancer Maternal Grandmother   . COPD Paternal 86   . Alzheimer's disease Paternal Grandmother   . Colon cancer Neg Hx   . Esophageal cancer Neg Hx   . Liver cancer Neg Hx   . Pancreatic cancer Neg Hx   . Rectal cancer Neg Hx   . Stomach cancer  Neg Hx     Medications: Patient's Medications  New Prescriptions   No medications on file  Previous Medications   ASCORBIC ACID (VITAMIN C PO)    Take 1 tablet by mouth daily.   ASPIRIN-ACETAMINOPHEN-CAFFEINE (GOODYS EXTRA STRENGTH PO)    Take 1 packet by mouth as needed (For headache).   CETIRIZINE (ZYRTEC) 10 MG TABLET    Take 10 mg by mouth daily.   CHOLECALCIFEROL (VITAMIN D3) 2000 UNITS TABS    Take 2,000 Units by mouth daily.   CLOBETASOL CREAM (TEMOVATE) 0.05 %    Twice daily as needed   FLUTICASONE (FLONASE) 50 MCG/ACT  NASAL SPRAY    SHAKE LIQUID AND USE 1 SPRAY IN EACH NOSTRIL DAILY   GABAPENTIN (NEURONTIN) 300 MG CAPSULE    Take 1 capsule (300 mg total) by mouth 3 (three) times daily.   IBUPROFEN (ADVIL,MOTRIN) 600 MG TABLET    Take 600 mg by mouth 3 (three) times daily as needed (FOr TMJ Treatment).   IBUPROFEN (ADVIL,MOTRIN) 800 MG TABLET    TAKE 1 TABLET(800 MG) BY MOUTH DAILY AS NEEDED FOR MILD PAIN OR MODERATE PAIN   MONTELUKAST (SINGULAIR) 10 MG TABLET    Take 10 mg by mouth at bedtime.   OLMESARTAN-HYDROCHLOROTHIAZIDE (BENICAR HCT) 40-25 MG TABLET    Take 1 tablet by mouth daily.   ZOLPIDEM (AMBIEN CR) 6.25 MG CR TABLET    Take 6.25 mg by mouth at bedtime as needed for sleep.  Modified Medications   No medications on file  Discontinued Medications   OVER THE COUNTER MEDICATION    Vitamin C, One tablet daily.   OVER THE COUNTER MEDICATION    Goody's Powder Extra Strenght. One packet PRN as needed for headache.   TRIAMCINOLONE CREAM (KENALOG) 0.1 %    Apply 1 application topically 2 (two) times daily.   ZOLPIDEM (AMBIEN) 5 MG TABLET    Take 1 tablet (5 mg total) by mouth at bedtime as needed for sleep.    Physical Exam:  Vitals:   04/18/19 0835  BP: 118/80  Pulse: 78  Temp: 98 F (36.7 C)  TempSrc: Oral  SpO2: 96%  Weight: 163 lb (73.9 kg)  Height: 5\' 4"  (1.626 m)   Body mass index is 27.98 kg/m. Wt Readings from Last 3 Encounters:  04/18/19 163 lb (73.9 kg)  10/11/18 145 lb 12.8 oz (66.1 kg)  08/15/18 149 lb (67.6 kg)    Physical Exam Vitals signs reviewed.  Constitutional:      General: She is not in acute distress.    Appearance: She is well-developed.  HENT:     Head: Normocephalic and atraumatic.     Right Ear: External ear normal.     Left Ear: External ear normal.  Eyes:     General: No scleral icterus.    Pupils: Pupils are equal, round, and reactive to light.  Neck:     Musculoskeletal: Normal range of motion and neck supple.     Thyroid: No thyromegaly.      Vascular: No carotid bruit.     Trachea: No tracheal deviation.  Cardiovascular:     Rate and Rhythm: Normal rate and regular rhythm.     Heart sounds: No murmur. No friction rub. No gallop.   Pulmonary:     Effort: Pulmonary effort is normal. No respiratory distress.     Breath sounds: Normal breath sounds. No wheezing or rales.  Abdominal:     General: Bowel sounds are normal.  Palpations: Abdomen is soft. There is no hepatomegaly.  Musculoskeletal:        General: No deformity.  Lymphadenopathy:     Cervical: No cervical adenopathy.  Skin:    General: Skin is warm and dry.  Neurological:     Mental Status: She is alert and oriented to person, place, and time.     Deep Tendon Reflexes: Reflexes are normal and symmetric.  Psychiatric:        Behavior: Behavior normal.        Judgment: Judgment normal.     Labs reviewed: Basic Metabolic Panel: Recent Labs    04/28/18 1000 10/20/18 0944  NA 138 141  K 4.0 3.7  CL 100 104  CO2 27 26  GLUCOSE 78 86  BUN 17 20  CREATININE 0.58 0.65  CALCIUM 10.2 10.2   Liver Function Tests: Recent Labs    04/28/18 1000 10/20/18 0944  AST  --  19  ALT 30* 16  BILITOT  --  0.9  PROT  --  7.7   No results for input(s): LIPASE, AMYLASE in the last 8760 hours. No results for input(s): AMMONIA in the last 8760 hours. CBC: Recent Labs    10/20/18 0944  WBC 4.5  NEUTROABS 2,250  HGB 12.6  HCT 36.4  MCV 97.6  PLT 282   Lipid Panel: Recent Labs    10/20/18 0944  CHOL 223*  HDL 101  LDLCALC 108*  TRIG 47  CHOLHDL 2.2   TSH: No results for input(s): TSH in the last 8760 hours. A1C: Lab Results  Component Value Date   HGBA1C 5.3 06/19/2014     Assessment/Plan 1. Scoliosis - ibuprofen (ADVIL) 800 MG tablet; TAKE 1 TABLET(800 MG) BY MOUTH DAILY AS NEEDED FOR MILD PAIN OR MODERATE PAIN  Dispense: 30 tablet; Refill: 1  2. Benign essential HTN Stable on  olmesartan-hctz daily. Encouraged diet modifications as well.    3. Body mass index 27.0-27.9, adult Noted today  4. Increased BMI She previously was on medication to curb appitite which she has now stopped, also not on diet or doing exercise. Discussed restarting diet modifications with increase in physical activity as tolerates.   5. Left lumbosacral radiculopathy Stable, uses gabapentin PRN, has not needed recently  6. Allergic rhinitis due to pollen, unspecified seasonality Controlled on zyrtec. Uses singulair and flonase PRN.   7. Lichen sclerosus -following with dermatologist, uses clobetasol which she does not feel this has been beneficial, has an appt on July 8th with a dermatologist specialist.   8. Hyperlipidemia LDL goal <100 -encourage diet modifications, states she has had updated labs with her job and plans to get them to [place] for review.    Next appt: 6 months for routine followup, sooner if needed Mohan Erven K. [place], [facility] [phone]

## 2019-04-18 NOTE — Patient Instructions (Addendum)
Please sign record release for GYN when you check out so we can update your mammogram and PAP  Increase physical activity 30 mins 5 days a week.  Fat and Cholesterol Restricted Eating Plan Getting too much fat and cholesterol in your diet may cause health problems. Choosing the right foods helps keep your fat and cholesterol at normal levels. This can keep you from getting certain diseases. What are tips for following this plan? Meal planning  At meals, divide your plate into four equal parts: ? Fill one-half of your plate with vegetables and green salads. ? Fill one-fourth of your plate with whole grains. ? Fill one-fourth of your plate with low-fat (lean) protein foods.  Eat fish that is high in omega-3 fats at least two times a week. This includes mackerel, tuna, sardines, and salmon.  Eat foods that are high in fiber, such as whole grains, beans, apples, broccoli, carrots, peas, and barley. General tips   Work with your doctor to lose weight if you need to.  Avoid: ? Foods with added sugar. ? Fried foods. ? Foods with partially hydrogenated oils.  Limit alcohol intake to no more than 1 drink a day for nonpregnant women and 2 drinks a day for men. One drink equals 12 oz of beer, 5 oz of wine, or 1 oz of hard liquor. Reading food labels  Check food labels for: ? Trans fats. ? Partially hydrogenated oils. ? Saturated fat (g) in each serving. ? Cholesterol (mg) in each serving. ? Fiber (g) in each serving.  Choose foods with healthy fats, such as: ? Monounsaturated fats. ? Polyunsaturated fats. ? Omega-3 fats.  Choose grain products that have whole grains. Look for the word "whole" as the first word in the ingredient list. Cooking  Cook foods using low-fat methods. These include baking, boiling, grilling, and broiling.  Eat more home-cooked foods. Eat at restaurants and buffets less often.  Avoid cooking using saturated fats, such as butter, cream, palm oil, palm  kernel oil, and coconut oil. Recommended foods  Fruits  All fresh, canned (in natural juice), or frozen fruits. Vegetables  Fresh or frozen vegetables (raw, steamed, roasted, or grilled). Green salads. Grains  Whole grains, such as whole wheat or whole grain breads, crackers, cereals, and pasta. Unsweetened oatmeal, bulgur, barley, quinoa, or brown rice. Corn or whole wheat flour tortillas. Meats and other protein foods  Ground beef (85% or leaner), grass-fed beef, or beef trimmed of fat. Skinless chicken or [place]. Ground chicken or [place]. Pork trimmed of fat. All fish and seafood. Egg whites. Dried beans, peas, or lentils. Unsalted nuts or seeds. Unsalted canned beans. Nut butters without added sugar or oil. Dairy  Low-fat or nonfat dairy products, such as skim or 1% milk, 2% or reduced-fat cheeses, low-fat and fat-free ricotta or cottage cheese, or plain low-fat and nonfat yogurt. Fats and oils  Tub margarine without trans fats. Light or reduced-fat mayonnaise and salad dressings. Avocado. Olive, canola, sesame, or safflower oils. The items listed above may not be a complete list of foods and beverages you can eat. Contact a dietitian for more information. Foods to avoid Fruits  Canned fruit in heavy syrup. Fruit in cream or butter sauce. Fried fruit. Vegetables  Vegetables cooked in cheese, cream, or butter sauce. Fried vegetables. Grains  White bread. White pasta. White rice. Cornbread. Bagels, pastries, and croissants. Crackers and snack foods that contain trans fat and hydrogenated oils. Meats and other protein foods  Fatty cuts of meat. Ribs,  chicken wings, bacon, sausage, bologna, salami, chitterlings, fatback, hot dogs, bratwurst, and packaged lunch meats. Liver and organ meats. Whole eggs and egg yolks. Chicken and [place] with skin. Fried meat. Dairy  Whole or 2% milk, cream, half-and-half, and cream cheese. Whole milk cheeses. Whole-fat or sweetened yogurt. Full-fat  cheeses. Nondairy creamers and whipped toppings. Processed cheese, cheese spreads, and cheese curds. Beverages  Alcohol. Sugar-sweetened drinks such as sodas, lemonade, and fruit drinks. Fats and oils  Butter, stick margarine, lard, shortening, ghee, or bacon fat. Coconut, palm kernel, and palm oils. Sweets and desserts  Corn syrup, sugars, honey, and molasses. Candy. Jam and jelly. Syrup. Sweetened cereals. Cookies, pies, cakes, donuts, muffins, and ice cream. The items listed above may not be a complete list of foods and beverages you should avoid. Contact a dietitian for more information. Summary  Choosing the right foods helps keep your fat and cholesterol at normal levels. This can keep you from getting certain diseases.  At meals, fill one-half of your plate with vegetables and green salads.  Eat high-fiber foods, like whole grains, beans, apples, carrots, peas, and barley.  Limit added sugar, saturated fats, alcohol, and fried foods. This information is not intended to replace advice given to you by your health care provider. Make sure you discuss any questions you have with your health care provider. Document Released: 04/11/2012 Document Revised: 06/14/2018 Document Reviewed: 06/28/2017 Elsevier Interactive Patient Education  2019 [employer].

## 2019-05-06 ENCOUNTER — Other Ambulatory Visit: Payer: Self-pay | Admitting: Internal Medicine

## 2019-08-05 ENCOUNTER — Other Ambulatory Visit: Payer: Self-pay | Admitting: Internal Medicine

## 2019-10-30 ENCOUNTER — Other Ambulatory Visit: Payer: Self-pay

## 2019-10-30 ENCOUNTER — Encounter: Payer: 59 | Admitting: Nurse Practitioner

## 2019-10-30 NOTE — Progress Notes (Signed)
This encounter was created in error - please disregard.

## 2019-11-02 ENCOUNTER — Encounter: Payer: Self-pay | Admitting: Nurse Practitioner

## 2019-11-02 ENCOUNTER — Other Ambulatory Visit: Payer: Self-pay

## 2019-11-02 ENCOUNTER — Ambulatory Visit (INDEPENDENT_AMBULATORY_CARE_PROVIDER_SITE_OTHER): Payer: 59 | Admitting: Nurse Practitioner

## 2019-11-02 VITALS — BP 110/70 | HR 86 | Temp 97.5°F | Resp 20 | Ht 64.0 in | Wt 159.4 lb

## 2019-11-02 DIAGNOSIS — F5101 Primary insomnia: Secondary | ICD-10-CM

## 2019-11-02 DIAGNOSIS — I10 Essential (primary) hypertension: Secondary | ICD-10-CM | POA: Diagnosis not present

## 2019-11-02 DIAGNOSIS — J301 Allergic rhinitis due to pollen: Secondary | ICD-10-CM | POA: Diagnosis not present

## 2019-11-02 DIAGNOSIS — E785 Hyperlipidemia, unspecified: Secondary | ICD-10-CM | POA: Diagnosis not present

## 2019-11-02 DIAGNOSIS — L900 Lichen sclerosus et atrophicus: Secondary | ICD-10-CM

## 2019-11-02 DIAGNOSIS — R413 Other amnesia: Secondary | ICD-10-CM

## 2019-11-02 DIAGNOSIS — E663 Overweight: Secondary | ICD-10-CM

## 2019-11-02 DIAGNOSIS — E559 Vitamin D deficiency, unspecified: Secondary | ICD-10-CM

## 2019-11-02 NOTE — Progress Notes (Signed)
Careteam: Patient Care Team: Lauree Chandler, NP as PCP - General (Geriatric Medicine) Arlyss Gandy, PA-C (Dermatology) Loney Loh, MD (Dermatology)  Advanced Directive information    Allergies  Allergen Reactions  . Penicillin G Potassium In D5w     hives  . Sulfa Antibiotics     hives    Chief Complaint  Patient presents with  . Medical Management of Chronic Issues    6 month follow-up   . Immunizations    Discuss need for TD/TDaP  . Quality Metric Gaps    Discuss need for mammogram and pap      HPI: Patient is a 54 y.o. female for routine follow up in office.   Goes to Dr Gaetano Net- GYN for breast exam/mamogram and PAP/pelvic, follow up in february.   Had [place] in September, very mild symptoms- husband had it worse.   Back and leg pain- stable, uses gabapentin rarely when it starts to flare up.   Using a tacrolimus for her lichen sclerosus which is working much better vs clobetasol  Not doing exercise as she should. Weight gain since COVID. New year trying to do better.  Riding stationary bike and using treadmill.    Feels like her memory is terrible. It has not impacting her job. Does not remember names.  Does not feel as sharp. "foggy" Has been worked up by neurologist 3-4 years ago due to this.  Never sleeps well. Using Ambien CR, does not feel like this really helps. Uses 4-5 times.  Having a lot of postmenopausal symptoms.    Review of Systems:  Review of Systems  Constitutional: Negative for chills, fever and weight loss.  HENT: Negative for tinnitus.   Respiratory: Negative for cough, sputum production and shortness of breath.   Cardiovascular: Negative for chest pain, palpitations and leg swelling.  Gastrointestinal: Negative for abdominal pain, constipation, diarrhea and heartburn.  Genitourinary: Negative for dysuria, frequency and urgency.  Musculoskeletal: Negative for back pain, joint pain and myalgias.  Skin: Negative.    Neurological: Negative for dizziness and headaches.  Psychiatric/Behavioral: Positive for memory loss ("foggy"). Negative for depression. The patient has insomnia. The patient is not nervous/anxious.     Past Medical History:  Diagnosis Date  . Allergy   . Anemia   . GERD (gastroesophageal reflux disease)   . High blood pressure   . Leg pain   . Numbness   . Perimenopause    Past Surgical History:  Procedure Laterality Date  . BREAST EXCISIONAL BIOPSY Left    30 years ago; benign  . Cyst removed from Left Breast  1984  . DILATION AND CURETTAGE OF UTERUS     Uterine abalation  . TUBAL LIGATION    . WISDOM TOOTH EXTRACTION     Social History:   reports that she has never smoked. She has never used smokeless tobacco. She reports current alcohol use of about 6.0 standard drinks of alcohol per week. She reports that she does not use drugs.  Family History  Problem Relation Age of Onset  . Cancer Father        lung cancer  . Hypertension Father   . Hypertension Mother   . Arthritis Mother   . Cancer Maternal Grandmother   . COPD Paternal 12   . Alzheimer's disease Paternal Grandmother   . Colon cancer Neg Hx   . Esophageal cancer Neg Hx   . Liver cancer Neg Hx   . Pancreatic cancer Neg Hx   .  Rectal cancer Neg Hx   . Stomach cancer Neg Hx     Medications: Patient's Medications  New Prescriptions   No medications on file  Previous Medications   ASCORBIC ACID (VITAMIN C PO)    Take 1 tablet by mouth daily.   ASPIRIN-ACETAMINOPHEN-CAFFEINE (GOODYS EXTRA STRENGTH PO)    Take 1 packet by mouth as needed (For headache).   CETIRIZINE (ZYRTEC) 10 MG TABLET    Take 10 mg by mouth daily.   CHOLECALCIFEROL (VITAMIN D3) 2000 UNITS TABS    Take 2,000 Units by mouth daily.   CLOBETASOL CREAM (TEMOVATE) 0.05 %    Twice daily as needed   FLUTICASONE (FLONASE) 50 MCG/ACT NASAL SPRAY    SHAKE LIQUID AND USE 1 SPRAY IN EACH NOSTRIL DAILY   GABAPENTIN (NEURONTIN) 300 MG CAPSULE     Take 1 capsule (300 mg total) by mouth 3 (three) times daily.   IBUPROFEN (ADVIL) 800 MG TABLET    TAKE 1 TABLET(800 MG) BY MOUTH DAILY AS NEEDED FOR MILD PAIN OR MODERATE PAIN   MONTELUKAST (SINGULAIR) 10 MG TABLET    Take 10 mg by mouth at bedtime.   OLMESARTAN-HYDROCHLOROTHIAZIDE (BENICAR HCT) 40-25 MG TABLET    TAKE 1 TABLET DAILY   ZOLPIDEM (AMBIEN CR) 6.25 MG CR TABLET    Take 6.25 mg by mouth at bedtime as needed for sleep.  Modified Medications   No medications on file  Discontinued Medications   No medications on file    Physical Exam:  Vitals:   11/02/19 1441  BP: 110/70  Pulse: 86  Resp: 20  Temp: (!) 97.5 F (36.4 C)  TempSrc: Oral  SpO2: 98%  Weight: 159 lb 6.4 oz (72.3 kg)  Height: 5' 4"  (1.626 m)   Body mass index is 27.36 kg/m. Wt Readings from Last 3 Encounters:  11/02/19 159 lb 6.4 oz (72.3 kg)  04/18/19 163 lb (73.9 kg)  10/11/18 145 lb 12.8 oz (66.1 kg)    Physical Exam Constitutional:      General: She is not in acute distress.    Appearance: She is well-developed. She is not diaphoretic.  HENT:     Head: Normocephalic and atraumatic.     Mouth/Throat:     Pharynx: No oropharyngeal exudate.  Eyes:     Conjunctiva/sclera: Conjunctivae normal.     Pupils: Pupils are equal, round, and reactive to light.  Cardiovascular:     Rate and Rhythm: Normal rate and regular rhythm.     Heart sounds: Normal heart sounds.  Pulmonary:     Effort: Pulmonary effort is normal.     Breath sounds: Normal breath sounds.  Abdominal:     General: Bowel sounds are normal.     Palpations: Abdomen is soft.  Musculoskeletal:        General: No tenderness.     Cervical back: Normal range of motion and neck supple.  Skin:    General: Skin is warm and dry.  Neurological:     Mental Status: She is alert and oriented to person, place, and time.     Labs reviewed: Basic Metabolic Panel: No results for input(s): NA, K, CL, CO2, GLUCOSE, BUN, CREATININE, CALCIUM, MG,  PHOS, TSH in the last 8760 hours. Liver Function Tests: No results for input(s): AST, ALT, ALKPHOS, BILITOT, PROT, ALBUMIN in the last 8760 hours. No results for input(s): LIPASE, AMYLASE in the last 8760 hours. No results for input(s): AMMONIA in the last 8760 hours. CBC: No results for  input(s): WBC, NEUTROABS, HGB, HCT, MCV, PLT in the last 8760 hours. Lipid Panel: No results for input(s): CHOL, HDL, LDLCALC, TRIG, CHOLHDL, LDLDIRECT in the last 8760 hours. TSH: No results for input(s): TSH in the last 8760 hours. A1C: Lab Results  Component Value Date   HGBA1C 5.3 06/19/2014     Assessment/Plan 1. Benign essential HTN -controlled on current regimen, continues to make dietary modifications and working to increase physical activity. - CMP with eGFR([facility]); Future - CBC with Differential/Platelet; Future  2. Allergic rhinitis due to pollen, unspecified seasonality Stable on zyrtec and flonase  3. Overweight (BMI 25.0-29.9) -working on diet modifications and increasing physical activity as she has gained weight recently and contributes to COVID  4. Hyperlipidemia LDL goal <100 -diet controlled, will follow up fasting blood work. - CMP with eGFR([facility]); Future - Lipid panel; Future  5. Lichen sclerosus Following with dermatology and doing better with current treatment.  6. Vitamin D deficiency - Vitamin D, 25-hydroxy; Future  7. Memory loss -has seen neurologist for this, could be due to lack of sleep, see number 8. - Vitamin B12; Future  8. Primary insomnia Routine at night Limit caffeine No blue lights or background lights Turn off TV Sound machine okay Cognitive behavioral therapy recommended -on Ambien but not very beneficial   Next appt: 6 months.  Carlos American. Williams, [facility] [phone]

## 2019-11-02 NOTE — Patient Instructions (Addendum)
Routine at night Limit caffeine No blue lights or background lights Turn off TV Sound machine okay   Cognitive behavioral therapy-- you will need to research to find a local therapist for this.   Follow up when able for fasting labs  6 months for routine follow up   Insomnia Insomnia is a sleep disorder that makes it difficult to fall asleep or stay asleep. Insomnia can cause fatigue, low energy, difficulty concentrating, mood swings, and poor performance at work or school. There are three different ways to classify insomnia:  Difficulty falling asleep.  Difficulty staying asleep.  Waking up too early in the morning. Any type of insomnia can be long-term (chronic) or short-term (acute). Both are common. Short-term insomnia usually lasts for three months or less. Chronic insomnia occurs at least three times a week for longer than three months. What are the causes? Insomnia may be caused by another condition, situation, or substance, such as:  Anxiety.  Certain medicines.  Gastroesophageal reflux disease (GERD) or other gastrointestinal conditions.  Asthma or other breathing conditions.  Restless legs syndrome, sleep apnea, or other sleep disorders.  Chronic pain.  Menopause.  Stroke.  Abuse of alcohol, tobacco, or illegal drugs.  Mental health conditions, such as depression.  Caffeine.  Neurological disorders, such as Alzheimer's disease.  An overactive thyroid (hyperthyroidism). Sometimes, the cause of insomnia may not be known. What increases the risk? Risk factors for insomnia include:  Gender. Women are affected more often than men.  Age. Insomnia is more common as you get older.  Stress.  Lack of exercise.  Irregular work schedule or working night shifts.  Traveling between different time zones.  Certain medical and mental health conditions. What are the signs or symptoms? If you have insomnia, the main symptom is having trouble falling asleep  or having trouble staying asleep. This may lead to other symptoms, such as:  Feeling fatigued or having low energy.  Feeling nervous about going to sleep.  Not feeling rested in the morning.  Having trouble concentrating.  Feeling irritable, anxious, or depressed. How is this diagnosed? This condition may be diagnosed based on:  Your symptoms and medical history. Your health care provider may ask about: ? Your sleep habits. ? Any medical conditions you have. ? Your mental health.  A physical exam. How is this treated? Treatment for insomnia depends on the cause. Treatment may focus on treating an underlying condition that is causing insomnia. Treatment may also include:  Medicines to help you sleep.  Counseling or therapy.  Lifestyle adjustments to help you sleep better. Follow these instructions at home: Eating and drinking   Limit or avoid alcohol, caffeinated beverages, and cigarettes, especially close to bedtime. These can disrupt your sleep.  Do not eat a large meal or eat spicy foods right before bedtime. This can lead to digestive discomfort that can make it hard for you to sleep. Sleep habits   Keep a sleep diary to help you and your health care provider figure out what could be causing your insomnia. Write down: ? When you sleep. ? When you wake up during the night. ? How well you sleep. ? How rested you feel the next day. ? Any side effects of medicines you are taking. ? What you eat and drink.  Make your bedroom a dark, comfortable place where it is easy to fall asleep. ? Put up shades or blackout curtains to block light from outside. ? Use a white noise machine to block noise. ?  Keep the temperature cool.  Limit screen use before bedtime. This includes: ? Watching TV. ? Using your smartphone, tablet, or computer.  Stick to a routine that includes going to bed and waking up at the same times every day and night. This can help you fall asleep faster.  Consider making a quiet activity, such as reading, part of your nighttime routine.  Try to avoid taking naps during the day so that you sleep better at night.  Get out of bed if you are still awake after 15 minutes of trying to sleep. Keep the lights down, but try reading or doing a quiet activity. When you feel sleepy, go back to bed. General instructions  Take over-the-counter and prescription medicines only as told by your health care provider.  Exercise regularly, as told by your health care provider. Avoid exercise starting several hours before bedtime.  Use relaxation techniques to manage stress. Ask your health care provider to suggest some techniques that may work well for you. These may include: ? Breathing exercises. ? Routines to release muscle tension. ? Visualizing peaceful scenes.  Make sure that you drive carefully. Avoid driving if you feel very sleepy.  Keep all follow-up visits as told by your health care provider. This is important. Contact a health care provider if:  You are tired throughout the day.  You have trouble in your daily routine due to sleepiness.  You continue to have sleep problems, or your sleep problems get worse. Get help right away if:  You have serious thoughts about hurting yourself or someone else. If you ever feel like you may hurt yourself or others, or have thoughts about taking your own life, get help right away. You can go to your nearest emergency department or call:  Your local emergency services (911 in the [place]  A suicide crisis helpline, such as the [place] at (902)167-3120. This is open 24 hours a day. Summary  Insomnia is a sleep disorder that makes it difficult to fall asleep or stay asleep.  Insomnia can be long-term (chronic) or short-term (acute).  Treatment for insomnia depends on the cause. Treatment may focus on treating an underlying condition that is causing insomnia.  Keep a sleep  diary to help you and your health care provider figure out what could be causing your insomnia. This information is not intended to replace advice given to you by your health care provider. Make sure you discuss any questions you have with your health care provider. Document Revised: 09/23/2017 Document Reviewed: 07/21/2017 Elsevier Patient Education  2020 [employer].

## 2019-11-12 ENCOUNTER — Other Ambulatory Visit: Payer: Self-pay

## 2019-11-12 ENCOUNTER — Other Ambulatory Visit: Payer: 59

## 2019-11-12 DIAGNOSIS — R413 Other amnesia: Secondary | ICD-10-CM

## 2019-11-12 DIAGNOSIS — E559 Vitamin D deficiency, unspecified: Secondary | ICD-10-CM

## 2019-11-12 DIAGNOSIS — E785 Hyperlipidemia, unspecified: Secondary | ICD-10-CM

## 2019-11-12 DIAGNOSIS — I10 Essential (primary) hypertension: Secondary | ICD-10-CM

## 2019-11-13 LAB — CBC WITH DIFFERENTIAL/PLATELET
Absolute Monocytes: 475 cells/uL (ref 200–950)
Basophils Absolute: 11 cells/uL (ref 0–200)
Basophils Relative: 0.2 %
Eosinophils Absolute: 32 cells/uL (ref 15–500)
Eosinophils Relative: 0.6 %
HCT: 38.2 % (ref 35.0–45.0)
Hemoglobin: 13 g/dL (ref 11.7–15.5)
Lymphs Abs: 1636 cells/uL (ref 850–3900)
MCH: 33.9 pg — ABNORMAL HIGH (ref 27.0–33.0)
MCHC: 34 g/dL (ref 32.0–36.0)
MCV: 99.7 fL (ref 80.0–100.0)
MPV: 10.2 fL (ref 7.5–12.5)
Monocytes Relative: 8.8 %
Neutro Abs: 3245 cells/uL (ref 1500–7800)
Neutrophils Relative %: 60.1 %
Platelets: 295 10*3/uL (ref 140–400)
RBC: 3.83 10*6/uL (ref 3.80–5.10)
RDW: 12 % (ref 11.0–15.0)
Total Lymphocyte: 30.3 %
WBC: 5.4 10*3/uL (ref 3.8–10.8)

## 2019-11-13 LAB — COMPLETE METABOLIC PANEL WITH GFR
AG Ratio: 1.4 (calc) (ref 1.0–2.5)
ALT: 19 U/L (ref 6–29)
AST: 20 U/L (ref 10–35)
Albumin: 4.8 g/dL (ref 3.6–5.1)
Alkaline phosphatase (APISO): 50 U/L (ref 37–153)
BUN: 17 mg/dL (ref 7–25)
CO2: 28 mmol/L (ref 20–32)
Calcium: 10.2 mg/dL (ref 8.6–10.4)
Chloride: 100 mmol/L (ref 98–110)
Creat: 0.56 mg/dL (ref 0.50–1.05)
GFR, Est African American: 124 mL/min/{1.73_m2} (ref >=60)
GFR, Est Non African American: 107 mL/min/{1.73_m2} (ref >=60)
Globulin: 3.5 g/dL (calc) (ref 1.9–3.7)
Glucose, Bld: 86 mg/dL (ref 65–99)
Potassium: 3.8 mmol/L (ref 3.5–5.3)
Sodium: 140 mmol/L (ref 135–146)
Total Bilirubin: 0.6 mg/dL (ref 0.2–1.2)
Total Protein: 8.3 g/dL — ABNORMAL HIGH (ref 6.1–8.1)

## 2019-11-13 LAB — VITAMIN D 25 HYDROXY (VIT D DEFICIENCY, FRACTURES): Vit D, 25-Hydroxy: 48 ng/mL (ref 30–100)

## 2019-11-13 LAB — LIPID PANEL
Cholesterol: 229 mg/dL — ABNORMAL HIGH (ref <200)
HDL: 85 mg/dL (ref >=50)
LDL Cholesterol (Calc): 128 mg/dL (calc) — ABNORMAL HIGH
Non-HDL Cholesterol (Calc): 144 mg/dL (calc) — ABNORMAL HIGH (ref <130)
Total CHOL/HDL Ratio: 2.7 (calc) (ref <5.0)
Triglycerides: 65 mg/dL (ref <150)

## 2019-11-13 LAB — VITAMIN B12: Vitamin B-12: 1400 pg/mL — ABNORMAL HIGH (ref 200–1100)

## 2019-12-06 NOTE — Progress Notes (Signed)
12/06/2019 Anita Brandt [id number] 30-Jul-1966   Chief Complaint:  Acid reflux, abdominal cramping and vomiting   History of Present Illness: Anita Brandt is a 54 year old female with a past medical history of hypertension, anemia, GERD and colon polyps.  She tested positive for COVID-19 September [id number], uncomplicated course. She complains of having RLQ discomfort after eating for the past month. She developed generalized abdominal cramping followed by nonbloody diarrhea after eating a salad with [place] dressing which occurred 6 or 7 week ago. Two weeks later, she ate the same salad and she had abdominal cramping, diarrhea and vomiting. She described having  heartburn after this episode. She reports having a remote history of GERD 10 years ago. She has occasional heartburn. She feels as if food sits in her mid esophagus briefly but food doesn't actually get stuck. She is able to drink water during these times without difficulty. She took TUMs a few times but doesn't think it helped. Never had an EGD. She takes [employer] 1 or 2 packs daily for the past 6 years. Infrequently takes Ibuprofen. She is having some constipation. She previously passed 3 to 4 solid BMs daily, now passing 1 BM daily. No rectal bleeding or melena.  History of colon polyps. She underwent a colonoscopy 11/2017, two tubular adenomatous polyps were removed. No other complaints. No family history of esophageal, gastric or colorectal cancer.   CMP Latest Ref Rng & Units 11/12/2019 10/20/2018 04/28/2018  Glucose 65 - 99 mg/dL 86 86 78  BUN 7 - 25 mg/dL 17 20 17   Creatinine 0.50 - 1.05 mg/dL 0.56 0.65 0.58  Sodium 135 - 146 mmol/L 140 141 138  Potassium 3.5 - 5.3 mmol/L 3.8 3.7 4.0  Chloride 98 - 110 mmol/L 100 104 100  CO2 20 - 32 mmol/L 28 26 27   Calcium 8.6 - 10.4 mg/dL 10.2 10.2 10.2  Total Protein 6.1 - 8.1 g/dL 8.3(H) 7.7 -  Total Bilirubin 0.2 - 1.2 mg/dL 0.6 0.9 -  Alkaline Phos 33 - 115 U/L - - -  AST 10 - 35  U/L 20 19 -  ALT 6 - 29 U/L 19 16 30(H)   CBC Latest Ref Rng & Units 11/12/2019 10/20/2018 09/29/2017  WBC 3.8 - 10.8 Thousand/uL 5.4 4.5 5.0  Hemoglobin 11.7 - 15.5 g/dL 13.0 12.6 12.2  Hematocrit 35.0 - 45.0 % 38.2 36.4 36.1  Platelets 140 - 400 Thousand/uL 295 282 344    Past Medical History:  Diagnosis Date  . Allergy   . Anemia   . GERD (gastroesophageal reflux disease)   . High blood pressure   . Leg pain   . Numbness   . Perimenopause    Past Surgical History:  Procedure Laterality Date  . BREAST EXCISIONAL BIOPSY Left    30 years ago; benign  . Cyst removed from Left Breast  1984  . DILATION AND CURETTAGE OF UTERUS     Uterine abalation  . TUBAL LIGATION    . WISDOM TOOTH EXTRACTION      Colonoscopy 11/29/2017 by Dr. Hilarie Fredrickson: - One 3 mm tubular adenomatous polyp at the ileocecal valve, removed. - One 5 mm tubular adenomatous polyp in the ascending colon, removed.  - Mild diverticulosis in the sigmoid colon. - The examination was otherwise normal on direct and retroflexion views. - 5 year colonoscopy recall.   Current Outpatient Medications on File Prior to Visit  Medication Sig Dispense Refill  . Ascorbic Acid (VITAMIN C PO)  Take 1 tablet by mouth daily.    . Aspirin-Acetaminophen-Caffeine (GOODYS EXTRA STRENGTH PO) Take 1 packet by mouth as needed (For headache).    . cetirizine (ZYRTEC) 10 MG tablet Take 10 mg by mouth daily.    . Cholecalciferol (VITAMIN D3) 2000 units TABS Take 2,000 Units by mouth daily.    . fluticasone (FLONASE) 50 MCG/ACT nasal spray SHAKE LIQUID AND USE 1 SPRAY IN EACH NOSTRIL DAILY 16 g 3  . gabapentin (NEURONTIN) 300 MG capsule Take 1 capsule (300 mg total) by mouth 3 (three) times daily. 90 capsule 11  . ibuprofen (ADVIL) 800 MG tablet TAKE 1 TABLET(800 MG) BY MOUTH DAILY AS NEEDED FOR MILD PAIN OR MODERATE PAIN 30 tablet 1  . montelukast (SINGULAIR) 10 MG tablet Take 10 mg by mouth at bedtime.    Marland Kitchen olmesartan-hydrochlorothiazide  (BENICAR HCT) 40-25 MG tablet TAKE 1 TABLET DAILY 90 tablet 1  . tacrolimus (PROTOPIC) 0.1 % ointment Apply topically daily.    Marland Kitchen zolpidem (AMBIEN CR) 6.25 MG CR tablet Take 6.25 mg by mouth at bedtime as needed for sleep.     No current facility-administered medications on file prior to visit.   Allergies  Allergen Reactions  . Penicillin G Potassium In D5w     hives  . Sulfa Antibiotics     hives    Current Medications, Allergies, Past Medical History, Past Surgical History, Family History and Social History were reviewed in [employer] record.   Physical Exam: LMP  (LMP Unknown)   BP 118/78   Pulse 72   Temp 98.8 F (37.1 C)   Ht 5\' 4"  (1.626 m)   Wt 163 lb 6 oz (74.1 kg)   LMP  (LMP Unknown)   BMI 28.04 kg/m  General: Well developed  54 year old female in no acute distress. Head: Normocephalic and atraumatic. Eyes:  No scleral icterus. Conjunctiva pink . Ears: Normal auditory acuity. Lungs: Clear throughout to auscultation. Heart: Regular rate and rhythm, no murmur. Abdomen: Soft, nontender and nondistended. No masses or hepatomegaly. Normal bowel sounds x 4 quadrants.  Rectal: Deferred.  Musculoskeletal: Symmetrical with no gross deformities. Extremities: No edema. Neurological: Alert oriented x 4. No focal deficits.  Psychological:  Alert and cooperative. Normal mood and affect  Assessment and Recommendations:  15. 54 year old female with episodic N/V, abdominal cramping and diarrhea. -Omeprazole 20mg  once daily -Dicyclomine 10mg  1 tab po tid PRN -Patient to call office if N/V, abdominal cramping and diarrhea recurs, I will order a CTAP at time of next attack. She will go to the lab for a CBC, CMP, CRP and Lipase at time of next attack, orders entered into the lab.  -I discussed scheduling an EGD in the near future, to discuss further at the time of her follow up appointment in 4 weeks  -Avoid spicy acidic foods, avoid salad with [place]  dressing which triggered both attacks  -She will call our office if her symptoms worsen.   2. Constipation -Miralax 1 capful mixed in 8 ounces of water at bed time as tolerated.   3. History of colon polyps. -Next colonoscopy due 11/2022

## 2019-12-07 ENCOUNTER — Encounter: Payer: Self-pay | Admitting: Nurse Practitioner

## 2019-12-07 ENCOUNTER — Ambulatory Visit (INDEPENDENT_AMBULATORY_CARE_PROVIDER_SITE_OTHER): Payer: 59 | Admitting: Nurse Practitioner

## 2019-12-07 ENCOUNTER — Other Ambulatory Visit (INDEPENDENT_AMBULATORY_CARE_PROVIDER_SITE_OTHER): Payer: 59

## 2019-12-07 DIAGNOSIS — R1031 Right lower quadrant pain: Secondary | ICD-10-CM

## 2019-12-07 DIAGNOSIS — K219 Gastro-esophageal reflux disease without esophagitis: Secondary | ICD-10-CM

## 2019-12-07 DIAGNOSIS — R197 Diarrhea, unspecified: Secondary | ICD-10-CM

## 2019-12-07 DIAGNOSIS — K5900 Constipation, unspecified: Secondary | ICD-10-CM | POA: Diagnosis not present

## 2019-12-07 DIAGNOSIS — R112 Nausea with vomiting, unspecified: Secondary | ICD-10-CM | POA: Insufficient documentation

## 2019-12-07 DIAGNOSIS — R109 Unspecified abdominal pain: Secondary | ICD-10-CM

## 2019-12-07 LAB — CBC WITH DIFFERENTIAL/PLATELET
Basophils Absolute: 0.1 10*3/uL (ref 0.0–0.1)
Basophils Relative: 1.5 % (ref 0.0–3.0)
Eosinophils Absolute: 0 10*3/uL (ref 0.0–0.7)
Eosinophils Relative: 0.8 % (ref 0.0–5.0)
HCT: 38.2 % (ref 36.0–46.0)
Hemoglobin: 13.1 g/dL (ref 12.0–15.0)
Lymphocytes Relative: 33.5 % (ref 12.0–46.0)
Lymphs Abs: 2 10*3/uL (ref 0.7–4.0)
MCHC: 34.3 g/dL (ref 30.0–36.0)
MCV: 98.7 fl (ref 78.0–100.0)
Monocytes Absolute: 0.6 10*3/uL (ref 0.1–1.0)
Monocytes Relative: 9.8 % (ref 3.0–12.0)
Neutro Abs: 3.2 10*3/uL (ref 1.4–7.7)
Neutrophils Relative %: 54.4 % (ref 43.0–77.0)
Platelets: 293 10*3/uL (ref 150.0–400.0)
RBC: 3.87 Mil/uL (ref 3.87–5.11)
RDW: 13.2 % (ref 11.5–15.5)
WBC: 5.8 10*3/uL (ref 4.0–10.5)

## 2019-12-07 LAB — COMPREHENSIVE METABOLIC PANEL
ALT: 15 U/L (ref 0–35)
AST: 16 U/L (ref 0–37)
Albumin: 4.7 g/dL (ref 3.5–5.2)
Alkaline Phosphatase: 55 U/L (ref 39–117)
BUN: 13 mg/dL (ref 6–23)
CO2: 30 mEq/L (ref 19–32)
Calcium: 10.4 mg/dL (ref 8.4–10.5)
Chloride: 103 mEq/L (ref 96–112)
Creatinine, Ser: 0.57 mg/dL (ref 0.40–1.20)
GFR: 134.52 mL/min (ref >60.00)
Glucose, Bld: 84 mg/dL (ref 70–99)
Potassium: 3.9 mEq/L (ref 3.5–5.1)
Sodium: 141 mEq/L (ref 135–145)
Total Bilirubin: 0.7 mg/dL (ref 0.2–1.2)
Total Protein: 8.6 g/dL — ABNORMAL HIGH (ref 6.0–8.3)

## 2019-12-07 LAB — C-REACTIVE PROTEIN: CRP: <1 mg/dL (ref 0.5–20.0)

## 2019-12-07 LAB — LIPASE: Lipase: 15 U/L (ref 11.0–59.0)

## 2019-12-07 MED ORDER — DICYCLOMINE HCL 10 MG PO CAPS: 10.0000 mg | ORAL_CAPSULE | Freq: Three times a day (TID) | ORAL | 0 refills | Status: DC | PRN

## 2019-12-07 MED ORDER — OMEPRAZOLE 20 MG PO CPDR: DELAYED_RELEASE_CAPSULE | ORAL | 0 refills | Status: DC

## 2019-12-07 NOTE — Patient Instructions (Addendum)
If you are age 53 or older, your body mass index should be between 23-30. Your Body mass index is 28.04 kg/m. If this is out of the aforementioned range listed, please consider follow up with your Primary Care Provider.  If you are age 13 or younger, your body mass index should be between 19-25. Your Body mass index is 28.04 kg/m. If this is out of the aformentioned range listed, please consider follow up with your Primary Care Provider.    Due to recent changes in healthcare laws, you may see the results of your imaging and laboratory studies on MyChart before your provider has had a chance to review them.  We understand that in some cases there may be results that are confusing or concerning to you. Not all laboratory results come back in the same time frame and the provider may be waiting for multiple results in order to interpret others.  Please give [place] 48 hours in order for your provider to thoroughly review all the results before contacting the office for clarification of your results.   We have sent the following medications to your pharmacy for you to pick up at your convenience:  START: omeprazole 20mg  take 1 capsule 30 minutes before breakfast everyday.  START: dicyclomine 10mg  take 1 tablet three times daily as needed for abdominal pain  Please purchase the following medications over the counter and take as directed:  START: Miralax over-the-counter at night as needed for constipation.  Please call office if abdominal pain returns.  We will order CT abdomen/pelvis at that time.  Return for lab work if symptoms recur.   Follow up with Dr Hilarie Fredrickson in 4 weeks on 01-03-20 at 3:40pm

## 2019-12-12 NOTE — Progress Notes (Signed)
Addendum: Reviewed and agree with assessment and management plan. Tip Atienza M, MD  

## 2019-12-31 ENCOUNTER — Telehealth: Payer: Self-pay | Admitting: *Deleted

## 2019-12-31 ENCOUNTER — Encounter: Payer: Self-pay | Admitting: *Deleted

## 2019-12-31 NOTE — Telephone Encounter (Signed)
Patient notified and agreed.  

## 2019-12-31 NOTE — Telephone Encounter (Signed)
Patient called and stated that she is scheduled to have the [place] Vaccination on Wednesday. Stated that she wanted to make sure it is ok for her to get it being allergic to Penicillin and Sulfa.  Please Advise.

## 2019-12-31 NOTE — Telephone Encounter (Signed)
Yes I have not heard that this is an issue

## 2020-01-03 ENCOUNTER — Other Ambulatory Visit: Payer: Self-pay

## 2020-01-03 ENCOUNTER — Encounter: Payer: Self-pay | Admitting: Internal Medicine

## 2020-01-03 ENCOUNTER — Ambulatory Visit (INDEPENDENT_AMBULATORY_CARE_PROVIDER_SITE_OTHER): Payer: 59 | Admitting: Internal Medicine

## 2020-01-03 VITALS — BP 132/84 | HR 101 | Temp 98.2°F | Ht 64.0 in | Wt 163.1 lb

## 2020-01-03 DIAGNOSIS — R109 Unspecified abdominal pain: Secondary | ICD-10-CM

## 2020-01-03 DIAGNOSIS — R110 Nausea: Secondary | ICD-10-CM

## 2020-01-03 DIAGNOSIS — R195 Other fecal abnormalities: Secondary | ICD-10-CM

## 2020-01-03 NOTE — Patient Instructions (Signed)
You have been scheduled for an abdominal ultrasound at [facility] (1st floor of hospital) on Tuesday, 01/08/20 at 8:30 am. Please arrive 15 minutes prior to your appointment for registration. Make certain not to have anything to eat or drink 6 hours prior to your appointment. Should you need to reschedule your appointment, please contact radiology at [phone]. This test typically takes about 30 minutes to perform.  If you are age 53 or older, your body mass index should be between 23-30. Your Body mass index is 28 kg/m. If this is out of the aforementioned range listed, please consider follow up with your Primary Care Provider.  If you are age 64 or younger, your body mass index should be between 19-25. Your Body mass index is 28 kg/m. If this is out of the aformentioned range listed, please consider follow up with your Primary Care Provider.

## 2020-01-03 NOTE — Progress Notes (Signed)
Subjective:    Patient ID: Anita Brandt, female    DOB: 11-Dec-1965, 54 y.o.   MRN: [id number]  HPI Anita Brandt is a 54 year old female with a history of GERD, colon polyps, hypertension, anemia, lichen sclerosis who returns for follow-up to evaluate intermittent nausea with abdominal pain and loose stool.  She is here alone today.  She was seen by Carl Best on 12/07/2019.  At that visit she was started on omeprazole 20 mg a day, given dicyclomine 10 mg 3 times daily as needed.  She reports that she has had 2 episodes since being seen here and they occurred on 12/09/2019 and 12/16/2019 both after eating.  They were both associated with nausea and crampy abdominal pain.  The second she felt the urge to defecate and tried 4 times but was not able to produce a stool and then on the fifth attempt had diarrhea.  She had nausea and a watery mouth which made her feel like she might vomit though she did not.  Pain is more mid abdomen to right abdomen.  She has been taking dicyclomine which does help the crampy pain 2 times sometimes 3 times a day.  She has been using omeprazole 20 mg more on an as-needed basis.  Acid reflux has not been an issue of late.  Bowel movements have been regular every morning.  She can sometimes have 2-3 bowel movements per day.  She does not feel constipated of late.  Stools can be loose at times.  She has continued using intermittent ibuprofen 800 mg and other times Goody powder.  She is unsure which foods have prompted these attacks though she knows that salads have been culprits before.  Symptoms have always been post eating  Review of Systems As per HPI, otherwise negative  Current Medications, Allergies, Past Medical History, Past Surgical History, Family History and Social History were reviewed in [employer] record.     Objective:   Physical Exam BP 132/84 (BP Location: Left Arm, Patient Position: Sitting, Cuff Size: Normal)   Pulse  (!) 101   Temp 98.2 F (36.8 C)   Ht 5\' 4"  (1.626 m)   Wt 163 lb 2 oz (74 kg)   LMP  (LMP Unknown)   SpO2 98%   BMI 28.00 kg/m  Gen: awake, alert, NAD HEENT: anicteric, op clear CV: RRR, no mrg Pulm: CTA b/l Abd: soft, NT/ND, +BS throughout Ext: no c/c/e Neuro: nonfocal  CBC    Component Value Date/Time   WBC 5.8 12/07/2019 1004   RBC 3.87 12/07/2019 1004   HGB 13.1 12/07/2019 1004   HCT 38.2 12/07/2019 1004   PLT 293.0 12/07/2019 1004   MCV 98.7 12/07/2019 1004   MCH 33.9 (H) 11/12/2019 0839   MCHC 34.3 12/07/2019 1004   RDW 13.2 12/07/2019 1004   LYMPHSABS 2.0 12/07/2019 1004   MONOABS 0.6 12/07/2019 1004   EOSABS 0.0 12/07/2019 1004   BASOSABS 0.1 12/07/2019 1004   CMP     Component Value Date/Time   NA 141 12/07/2019 1004   NA 138 10/24/2015 0956   K 3.9 12/07/2019 1004   CL 103 12/07/2019 1004   CO2 30 12/07/2019 1004   GLUCOSE 84 12/07/2019 1004   BUN 13 12/07/2019 1004   BUN 17 10/24/2015 0956   CREATININE 0.57 12/07/2019 1004   CREATININE 0.56 11/12/2019 0839   CALCIUM 10.4 12/07/2019 1004   PROT 8.6 (H) 12/07/2019 1004   PROT 8.4 02/27/2016 1010  ALBUMIN 4.7 12/07/2019 1004   ALBUMIN 4.9 02/27/2016 1010   AST 16 12/07/2019 1004   ALT 15 12/07/2019 1004   ALKPHOS 55 12/07/2019 1004   BILITOT 0.7 12/07/2019 1004   BILITOT 0.7 02/27/2016 1010   GFRNONAA 107 11/12/2019 0839   GFRAA 124 11/12/2019 0839   CRP normal  Lipase     Component Value Date/Time   LIPASE 15.0 12/07/2019 1004      Assessment & Plan:  54 year old female with a history of GERD, colon polyps, hypertension, anemia, lichen sclerosis who returns for follow-up to evaluate intermittent nausea with abdominal pain and loose stool.  1.  Intermittent abdominal pain with associated nausea and at times loose stools --dicyclomine has definitively helped her abdominal cramping.  She has not been using PPI daily. --Abdominal ultrasound to rule out gallstones --If negative consider  rifaximin x14 days for IBS and to reset her intestinal microbiota --If ultrasound negative and rifaximin does not help then consider EGD  2.  History of colon polyps --adenomatous or polyps removed February 2019, repeat would be February 2024  30 minutes total spent today including patient facing time, coordination of care, reviewing medical history/procedures/pertinent radiology studies, and documentation of the encounter.

## 2020-01-08 ENCOUNTER — Other Ambulatory Visit: Payer: Self-pay

## 2020-01-08 ENCOUNTER — Ambulatory Visit (HOSPITAL_COMMUNITY)
Admission: RE | Admit: 2020-01-08 | Discharge: 2020-01-08 | Disposition: A | Discharge status: Home / Self Care | Payer: 59 | Source: Ambulatory Visit | Attending: Internal Medicine | Admitting: Internal Medicine

## 2020-01-08 DIAGNOSIS — R109 Unspecified abdominal pain: Secondary | ICD-10-CM | POA: Insufficient documentation

## 2020-01-08 MED ORDER — RIFAXIMIN 550 MG PO TABS: 550.0000 mg | ORAL_TABLET | Freq: Three times a day (TID) | ORAL | 0 refills | Status: AC

## 2020-01-10 ENCOUNTER — Telehealth: Payer: Self-pay | Admitting: Internal Medicine

## 2020-01-10 NOTE — Telephone Encounter (Signed)
Pt inquired about Xifaxan prescription.  She was not clear whether she should be waiting an PA to be approved.

## 2020-01-11 NOTE — Telephone Encounter (Signed)
Patient advised that she should be hearing from [facility] regarding prior authorization or delivery of medication, but if she does not hear from them by sometime early next week, to let us know and we will inquire about this.  She verbalizes understanding.

## 2020-02-01 ENCOUNTER — Other Ambulatory Visit: Payer: Self-pay | Admitting: Internal Medicine

## 2020-05-02 ENCOUNTER — Ambulatory Visit: Payer: 59 | Admitting: Nurse Practitioner

## 2020-05-12 ENCOUNTER — Ambulatory Visit: Payer: 59 | Admitting: Nurse Practitioner

## 2020-05-19 ENCOUNTER — Ambulatory Visit: Payer: 59 | Admitting: Nurse Practitioner

## 2020-05-26 ENCOUNTER — Encounter: Payer: Self-pay | Admitting: Nurse Practitioner

## 2020-05-26 ENCOUNTER — Ambulatory Visit (INDEPENDENT_AMBULATORY_CARE_PROVIDER_SITE_OTHER): Payer: 59 | Admitting: Nurse Practitioner

## 2020-05-26 ENCOUNTER — Other Ambulatory Visit: Payer: Self-pay

## 2020-05-26 VITALS — BP 116/78 | HR 78 | Temp 97.1°F | Ht 64.0 in | Wt 160.0 lb

## 2020-05-26 DIAGNOSIS — E785 Hyperlipidemia, unspecified: Secondary | ICD-10-CM

## 2020-05-26 DIAGNOSIS — I10 Essential (primary) hypertension: Secondary | ICD-10-CM | POA: Diagnosis not present

## 2020-05-26 DIAGNOSIS — E663 Overweight: Secondary | ICD-10-CM

## 2020-05-26 DIAGNOSIS — F419 Anxiety disorder, unspecified: Secondary | ICD-10-CM

## 2020-05-26 DIAGNOSIS — F5101 Primary insomnia: Secondary | ICD-10-CM

## 2020-05-26 DIAGNOSIS — Z780 Asymptomatic menopausal state: Secondary | ICD-10-CM | POA: Diagnosis not present

## 2020-05-26 MED ORDER — PAROXETINE HCL 10 MG PO TABS: 10.0000 mg | ORAL_TABLET | Freq: Every day | ORAL | 1 refills | Status: DC

## 2020-05-26 NOTE — Progress Notes (Signed)
Careteam: Patient Care Team: Lauree Chandler, NP as PCP - General (Geriatric Medicine) Arlyss Gandy, PA-C (Dermatology) Loney Loh, MD (Dermatology)  PLACE OF SERVICE:  [place]  Advanced Directive information    Allergies  Allergen Reactions   Penicillin G Potassium In D5w     hives   Sulfa Antibiotics     hives    Chief Complaint  Patient presents with   Medical Management of Chronic Issues    6 month follow-up    Anxiety    Discuss anxiety treatment    Quality Metric Gaps    Discuss need for pap and mammogram   Immunizations    Covid vaccine UTD, discuss need for TD/TDaP      HPI: Patient is a 54 y.o. female for routine follow up.  [facility] and updated PAP and mammogram.   Does not get flu vaccine.   Post menopausal symptoms causing insomnia uses ambien per GYN which is beneficial.   No consistent exercise.  Hard to get motivated.   Anxiety- interrupts being able to turn her mind off. Ambien helps the sleep. Can not "shut it off"  Has issues with constipation and vomiting -saw GI and recommended a few medications. Has improved now.   Chronic back pain with sciatica- has not needed gabapentin; will use ibuprofen rarely  Review of Systems:  Review of Systems  Constitutional: Negative for chills, fever and weight loss.  HENT: Negative for tinnitus.   Respiratory: Negative for cough, sputum production and shortness of breath.   Cardiovascular: Negative for chest pain, palpitations and leg swelling.  Gastrointestinal: Negative for abdominal pain, constipation, diarrhea and heartburn.  Genitourinary: Negative for dysuria, frequency and urgency.  Musculoskeletal: Positive for back pain (rare). Negative for joint pain and myalgias.  Skin: Negative.   Neurological: Negative for dizziness and headaches.  Psychiatric/Behavioral: Negative for depression and memory loss. The patient is  nervous/anxious and has insomnia.     Past Medical History:  Diagnosis Date   Allergy    Anemia    Diverticulosis    GERD (gastroesophageal reflux disease)    High blood pressure    Leg pain    Numbness    Perimenopause    Tubular adenoma of colon    Past Surgical History:  Procedure Laterality Date   BREAST EXCISIONAL BIOPSY Left    30 years ago; benign   Cyst removed from Left Breast  [place]     Uterine abalation   TUBAL LIGATION     WISDOM TOOTH EXTRACTION     Social History:   reports that she has never smoked. She has never used smokeless tobacco. She reports current alcohol use of about 6.0 standard drinks of alcohol per week. She reports that she does not use drugs.  Family History  Problem Relation Age of Onset   Cancer Father        lung cancer   Hypertension Father    Hypertension Mother    Arthritis Mother    Cancer Maternal Grandmother    COPD Paternal Aunt    Alzheimer's disease Paternal Grandmother    Colon cancer Neg Hx    Esophageal cancer Neg Hx    Liver cancer Neg Hx    Pancreatic cancer Neg Hx    Rectal cancer Neg Hx    Stomach cancer Neg Hx     Medications: Patient's Medications  New Prescriptions  No medications on file  Previous Medications   ASCORBIC ACID (VITAMIN C PO)    Take 1 tablet by mouth daily.   ASPIRIN-ACETAMINOPHEN-CAFFEINE (GOODYS EXTRA STRENGTH PO)    Take 1 packet by mouth as needed (For headache).   CETIRIZINE (ZYRTEC) 10 MG TABLET    Take 10 mg by mouth daily.   CHOLECALCIFEROL (VITAMIN D3) 2000 UNITS TABS    Take 2,000 Units by mouth daily.   FLUTICASONE (FLONASE) 50 MCG/ACT NASAL SPRAY    SHAKE LIQUID AND USE 1 SPRAY IN EACH NOSTRIL DAILY   GABAPENTIN (NEURONTIN) 300 MG CAPSULE    Take 1 capsule (300 mg total) by mouth 3 (three) times daily.   IBUPROFEN (ADVIL) 800 MG TABLET    TAKE 1 TABLET(800 MG) BY MOUTH DAILY AS NEEDED FOR MILD PAIN OR MODERATE PAIN    MONTELUKAST (SINGULAIR) 10 MG TABLET    Take 10 mg by mouth at bedtime.   OLMESARTAN-HYDROCHLOROTHIAZIDE (BENICAR HCT) 40-25 MG TABLET    TAKE 1 TABLET DAILY   TACROLIMUS (PROTOPIC) 0.1 % OINTMENT    Apply topically daily.   ZOLPIDEM (AMBIEN) 5 MG TABLET    Take 5 mg by mouth at bedtime as needed.  Modified Medications   No medications on file  Discontinued Medications   DICYCLOMINE (BENTYL) 10 MG CAPSULE    Take 1 capsule (10 mg total) by mouth 3 (three) times daily as needed (for abdominal pain).   OMEPRAZOLE (PRILOSEC) 20 MG CAPSULE    Take 1 capsule daily 30 minutes before breakfast   ZOLPIDEM (AMBIEN CR) 6.25 MG CR TABLET    Take 6.25 mg by mouth at bedtime as needed for sleep.    Physical Exam:  Vitals:   05/26/20 0840  BP: 116/78  Pulse: 78  Temp: (!) 97.1 F (36.2 C)  TempSrc: Temporal  SpO2: 98%  Weight: 160 lb (72.6 kg)  Height: 5' 4"  (1.626 m)   Body mass index is 27.46 kg/m. Wt Readings from Last 3 Encounters:  05/26/20 160 lb (72.6 kg)  01/03/20 163 lb 2 oz (74 kg)  12/07/19 163 lb 6 oz (74.1 kg)    Physical Exam Constitutional:      General: She is not in acute distress.    Appearance: She is well-developed. She is not diaphoretic.  HENT:     Head: Normocephalic and atraumatic.     Mouth/Throat:     Pharynx: No oropharyngeal exudate.  Eyes:     Conjunctiva/sclera: Conjunctivae normal.     Pupils: Pupils are equal, round, and reactive to light.  Cardiovascular:     Rate and Rhythm: Normal rate and regular rhythm.     Heart sounds: Normal heart sounds.  Pulmonary:     Breath sounds: Normal breath sounds.  Abdominal:     General: Bowel sounds are normal.     Palpations: Abdomen is soft.  Musculoskeletal:        General: No tenderness.     Cervical back: Normal range of motion and neck supple.  Skin:    General: Skin is warm and dry.  Neurological:     Mental Status: She is alert and oriented to person, place, and time.     Labs reviewed: Basic  Metabolic Panel: Recent Labs    11/12/19 0839 12/07/19 1004  NA 140 141  K 3.8 3.9  CL 100 103  CO2 28 30  GLUCOSE 86 84  BUN 17 13  CREATININE 0.56 0.57  CALCIUM 10.2 10.4  Liver Function Tests: Recent Labs    11/12/19 0839 12/07/19 1004  AST 20 16  ALT 19 15  ALKPHOS  --  55  BILITOT 0.6 0.7  PROT 8.3* 8.6*  ALBUMIN  --  4.7   Recent Labs    12/07/19 1004  LIPASE 15.0   No results for input(s): AMMONIA in the last 8760 hours. CBC: Recent Labs    11/12/19 0839 12/07/19 1004  WBC 5.4 5.8  NEUTROABS 3,245 3.2  HGB 13.0 13.1  HCT 38.2 38.2  MCV 99.7 98.7  PLT 295 293.0   Lipid Panel: Recent Labs    11/12/19 0839  CHOL 229*  HDL 85  LDLCALC 128*  TRIG 65  CHOLHDL 2.7   TSH: No results for input(s): TSH in the last 8760 hours. A1C: Lab Results  Component Value Date   HGBA1C 5.3 06/19/2014     Assessment/Plan 1. Benign essential HTN -well controlled on olmesartan-hctz with dietary modifications.  - CMP with eGFR([facility])  2. Anxiety -having a hard time with increase in anxiety. -also with post - PARoxetine (PAXIL) 10 MG tablet; Take 1 tablet (10 mg total) by mouth daily.  Dispense: 30 tablet; Refill: 1  3. Postmenopausal Followed by GYN. Reports with increase in anxiety as she has been going through this with increase in hot flashes. - PARoxetine (PAXIL) 10 MG tablet; Take 1 tablet (10 mg total) by mouth daily.  Dispense: 30 tablet; Refill: 1  4. Hyperlipidemia LDL goal <100 -diet modifications and increase in physical activity encouraged.  - Lipid Panel - CMP with eGFR([facility])  5. Primary insomnia -stable on Ambien. Encouraged sleep routine.    6. Overweight (BMI 25.0-29.9) -discussed diet modification and increase in physical activity to get to goal weight. This will also help with mental health as well.   Next appt: 4 weeks for anxiety Garlene Apperson K. [place], [facility] [phone]

## 2020-05-28 LAB — COMPLETE METABOLIC PANEL WITH GFR
AG Ratio: 1.4 (calc) (ref 1.0–2.5)
ALT: 21 U/L (ref 6–29)
AST: 22 U/L (ref 10–35)
Albumin: 4.8 g/dL (ref 3.6–5.1)
Alkaline phosphatase (APISO): 60 U/L (ref 37–153)
BUN: 17 mg/dL (ref 7–25)
CO2: 24 mmol/L (ref 20–32)
Calcium: 10 mg/dL (ref 8.6–10.4)
Chloride: 101 mmol/L (ref 98–110)
Creat: 0.62 mg/dL (ref 0.50–1.05)
GFR, Est African American: 120 mL/min/{1.73_m2} (ref >=60)
GFR, Est Non African American: 104 mL/min/{1.73_m2} (ref >=60)
Globulin: 3.5 g/dL (calc) (ref 1.9–3.7)
Glucose, Bld: 73 mg/dL (ref 65–99)
Potassium: 3.8 mmol/L (ref 3.5–5.3)
Sodium: 139 mmol/L (ref 135–146)
Total Bilirubin: 0.6 mg/dL (ref 0.2–1.2)
Total Protein: 8.3 g/dL — ABNORMAL HIGH (ref 6.1–8.1)

## 2020-05-28 LAB — TEST AUTHORIZATION

## 2020-05-28 LAB — LIPID PANEL
Cholesterol: 213 mg/dL — ABNORMAL HIGH (ref <200)
HDL: 103 mg/dL (ref >=50)
LDL Cholesterol (Calc): 93 mg/dL (calc)
Non-HDL Cholesterol (Calc): 110 mg/dL (calc) (ref <130)
Total CHOL/HDL Ratio: 2.1 (calc) (ref <5.0)
Triglycerides: 84 mg/dL (ref <150)

## 2020-05-28 LAB — TSH: TSH: 0.38 mIU/L — ABNORMAL LOW

## 2020-05-28 LAB — T4, FREE: Free T4: 1.1 ng/dL (ref 0.8–1.8)

## 2020-05-28 LAB — T3, FREE: T3, Free: 2.9 pg/mL (ref 2.3–4.2)

## 2020-06-23 ENCOUNTER — Ambulatory Visit: Payer: 59 | Admitting: Nurse Practitioner

## 2020-07-04 ENCOUNTER — Other Ambulatory Visit: Payer: Self-pay

## 2020-07-04 ENCOUNTER — Encounter: Payer: Self-pay | Admitting: Nurse Practitioner

## 2020-07-04 ENCOUNTER — Ambulatory Visit (INDEPENDENT_AMBULATORY_CARE_PROVIDER_SITE_OTHER): Payer: 59 | Admitting: Nurse Practitioner

## 2020-07-04 ENCOUNTER — Telehealth: Payer: Self-pay

## 2020-07-04 DIAGNOSIS — F419 Anxiety disorder, unspecified: Secondary | ICD-10-CM | POA: Diagnosis not present

## 2020-07-04 NOTE — Progress Notes (Signed)
This service is provided via telemedicine  No vital signs collected/recorded due to the encounter was a telemedicine visit.   Location of patient (ex: home, work):  Home   Patient consents to a telephone visit:  Yes, see telephone encounter dated 9/10//2021 with annual consent   Location of the provider (ex: office, home):  [facility], Office   Name of any referring provider:  N/A  Names of all persons participating in the telemedicine service and their role in the encounter:  Carroll Kinds, CMA, Sherrie Mustache, NP, and Patient     Time spent on call:  5 minutes      Careteam: Patient Care Team: Lauree Chandler, NP as PCP - General (Geriatric Medicine) Arlyss Gandy, PA-C (Dermatology) Loney Loh, MD (Dermatology) Everlene Farrier, MD as Consulting Physician (Obstetrics and Gynecology)  PLACE OF SERVICE:  [place]  Advanced Directive information    Allergies  Allergen Reactions  . Penicillin G Potassium In D5w     hives  . Sulfa Antibiotics     hives    Chief Complaint  Patient presents with  . Follow-up    4 week follow-up on anxiety. Telephone/video visit      HPI: Patient is a 54 y.o. female for follow up on anxiety.  Pt was started on paxil due to worsening anxiety and hot flashes. Reports it took a week to see any benefit and has continue to see help. No thoughts of SI or HI.  No side effects noted.   Not sure if paxil is really benefiting hot flashes but helping anxiety. Mom recently had back surgery and was able to tolerate this better than she thought.  Feels calmer at bedtime. Continues to use Ambien.   Review of Systems:  Review of Systems  Psychiatric/Behavioral: Negative for memory loss. The patient is nervous/anxious and has insomnia.        Improved anxiety on medication     Past Medical History:  Diagnosis Date  . Allergy   . Anemia   . Diverticulosis   . GERD (gastroesophageal reflux  disease)   . High blood pressure   . Leg pain   . Numbness   . Perimenopause   . Tubular adenoma of colon    Past Surgical History:  Procedure Laterality Date  . BREAST EXCISIONAL BIOPSY Left    30 years ago; benign  . Cyst removed from Left Breast  1984  . DILATION AND CURETTAGE OF UTERUS     Uterine abalation  . TUBAL LIGATION    . WISDOM TOOTH EXTRACTION     Social History:   reports that she has never smoked. She has never used smokeless tobacco. She reports current alcohol use of about 6.0 standard drinks of alcohol per week. She reports that she does not use drugs.  Family History  Problem Relation Age of Onset  . Cancer Father        lung cancer  . Hypertension Father   . Hypertension Mother   . Arthritis Mother   . Cancer Maternal Grandmother   . COPD Paternal 59   . Alzheimer's disease Paternal Grandmother   . Colon cancer Neg Hx   . Esophageal cancer Neg Hx   . Liver cancer Neg Hx   . Pancreatic cancer Neg Hx   . Rectal cancer Neg Hx   . Stomach cancer Neg Hx     Medications: Patient's Medications  New Prescriptions   No medications on  file  Previous Medications   ASCORBIC ACID (VITAMIN C PO)    Take 1 tablet by mouth daily.   ASPIRIN-ACETAMINOPHEN-CAFFEINE (GOODYS EXTRA STRENGTH PO)    Take 1 packet by mouth as needed (For headache).   CETIRIZINE (ZYRTEC) 10 MG TABLET    Take 10 mg by mouth daily.   CHOLECALCIFEROL (VITAMIN D3) 2000 UNITS TABS    Take 2,000 Units by mouth daily.   FLUTICASONE (FLONASE) 50 MCG/ACT NASAL SPRAY    SHAKE LIQUID AND USE 1 SPRAY IN EACH NOSTRIL DAILY   GABAPENTIN (NEURONTIN) 300 MG CAPSULE    Take 1 capsule (300 mg total) by mouth 3 (three) times daily.   IBUPROFEN (ADVIL) 800 MG TABLET    TAKE 1 TABLET(800 MG) BY MOUTH DAILY AS NEEDED FOR MILD PAIN OR MODERATE PAIN   MONTELUKAST (SINGULAIR) 10 MG TABLET    Take 10 mg by mouth at bedtime.    OLMESARTAN-HYDROCHLOROTHIAZIDE (BENICAR HCT) 40-25 MG TABLET    TAKE 1 TABLET DAILY     PAROXETINE (PAXIL) 10 MG TABLET    Take 1 tablet (10 mg total) by mouth daily.   TACROLIMUS (PROTOPIC) 0.1 % OINTMENT    Apply topically daily.   ZOLPIDEM (AMBIEN) 5 MG TABLET    Take 5 mg by mouth at bedtime as needed.  Modified Medications   No medications on file  Discontinued Medications   No medications on file    Physical Exam:  There were no vitals filed for this visit. There is no height or weight on file to calculate BMI. Wt Readings from Last 3 Encounters:  05/26/20 160 lb (72.6 kg)  01/03/20 163 lb 2 oz (74 kg)  12/07/19 163 lb 6 oz (74.1 kg)    Physical Exam  Labs reviewed: Basic Metabolic Panel: Recent Labs    11/12/19 0839 12/07/19 1004 05/26/20 0919  NA 140 141 139  K 3.8 3.9 3.8  CL 100 103 101  CO2 28 30 24   GLUCOSE 86 84 73  BUN 17 13 17   CREATININE 0.56 0.57 0.62  CALCIUM 10.2 10.4 10.0  TSH  --   --  0.38*   Liver Function Tests: Recent Labs    11/12/19 0839 12/07/19 1004 05/26/20 0919  AST 20 16 22   ALT 19 15 21   ALKPHOS  --  55  --   BILITOT 0.6 0.7 0.6  PROT 8.3* 8.6* 8.3*  ALBUMIN  --  4.7  --    Recent Labs    12/07/19 1004  LIPASE 15.0   No results for input(s): AMMONIA in the last 8760 hours. CBC: Recent Labs    11/12/19 0839 12/07/19 1004  WBC 5.4 5.8  NEUTROABS 3,245 3.2  HGB 13.0 13.1  HCT 38.2 38.2  MCV 99.7 98.7  PLT 295 293.0   Lipid Panel: Recent Labs    11/12/19 0839 05/26/20 0919  CHOL 229* 213*  HDL 85 103  LDLCALC 128* 93  TRIG 65 84  CHOLHDL 2.7 2.1   TSH: Recent Labs    05/26/20 0919  TSH 0.38*   A1C: Lab Results  Component Value Date   HGBA1C 5.3 06/19/2014     Assessment/Plan 1. Anxiety Improved on paxil, will continue current regimen   Next appt: 6 month Clayton Jarmon K. Harle Battiest  Virtual Visit via Video Note  I connected with Leahann Lempke [place] on 07/04/20 at  1:00 PM EDT by a video enabled telemedicine application and verified that I am speaking with the correct person  using two identifiers.  Location: Patient: home Provider: [place]   I discussed the limitations of evaluation and management by telemedicine and the availability of in person appointments. The patient expressed understanding and agreed to proceed.    I discussed the assessment and treatment plan with the patient. The patient was provided an opportunity to ask questions and all were answered. The patient agreed with the plan and demonstrated an understanding of the instructions.   The patient was advised to call back or seek an in-person evaluation if the symptoms worsen or if the condition fails to improve as anticipated.  I provided 13 minutes of non-face-to-face time during this encounter.  Carlos American. Dewaine Oats, AGNP Avs printed and mailed.    [facility] [phone]

## 2020-07-04 NOTE — Telephone Encounter (Signed)
[name], fader are scheduled for a virtual visit with your provider today.    Just as we do with appointments in the office, we must obtain your consent to participate.  Your consent will be active for this visit and any virtual visit you may have with one of our providers in the next 365 days.    If you have a MyChart account, I can also send a copy of this consent to you electronically.  All virtual visits are billed to your insurance company just like a traditional visit in the office.  As this is a virtual visit, video technology does not allow for your provider to perform a traditional examination.  This may limit your provider's ability to fully assess your condition.  If your provider identifies any concerns that need to be evaluated in person or the need to arrange testing such as labs, EKG, etc, we will make arrangements to do so.    Although advances in technology are sophisticated, we cannot ensure that it will always work on either your end or our end.  If the connection with a video visit is poor, we may have to switch to a telephone visit.  With either a video or telephone visit, we are not always able to ensure that we have a secure connection.   I need to obtain your verbal consent now.   Are you willing to proceed with your visit today?   Anita Brandt has provided verbal consent on 07/04/2020 for a virtual visit (video or telephone).   Carroll Kinds, CMA 07/04/2020  1:11 PM

## 2020-07-10 ENCOUNTER — Telehealth: Payer: Self-pay | Admitting: Nurse Practitioner

## 2020-07-10 NOTE — Telephone Encounter (Signed)
I called the patient 9/16 to schedule a 6 Month follow up with [facility] and left the patient a message to call back when she could to schedule it.

## 2020-07-18 ENCOUNTER — Other Ambulatory Visit: Payer: Self-pay | Admitting: Nurse Practitioner

## 2020-07-18 DIAGNOSIS — Z780 Asymptomatic menopausal state: Secondary | ICD-10-CM

## 2020-07-18 DIAGNOSIS — F419 Anxiety disorder, unspecified: Secondary | ICD-10-CM

## 2020-07-18 NOTE — Telephone Encounter (Signed)
Pharmacy requested refill. Pended Rx and sent to Jessica for approval due to HIGH ALERT Warning.  

## 2021-01-06 IMAGING — US US ABDOMEN COMPLETE
1 series · 14 of 25 positions shown · non-contrast
Comparison: None

CLINICAL DATA: Abdominal pain

EXAM:
ABDOMEN ULTRASOUND COMPLETE

[Series 1: us abdomen complete · 14 of 127 slices shown]
[im 1/127]
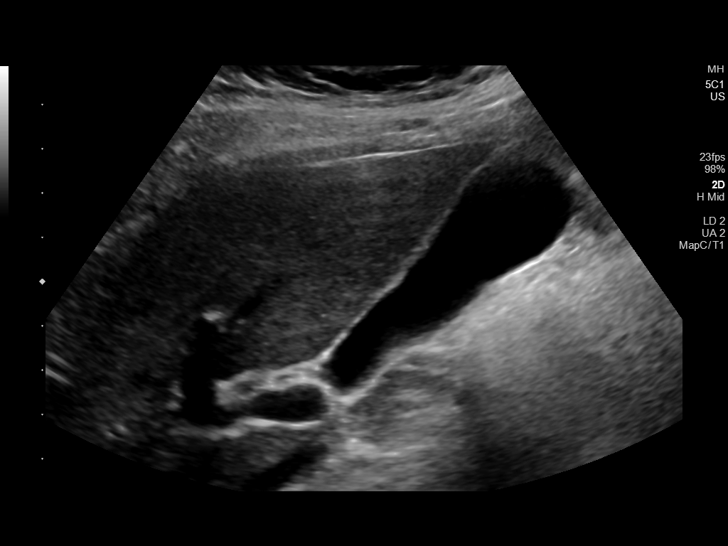
[im 11/127]
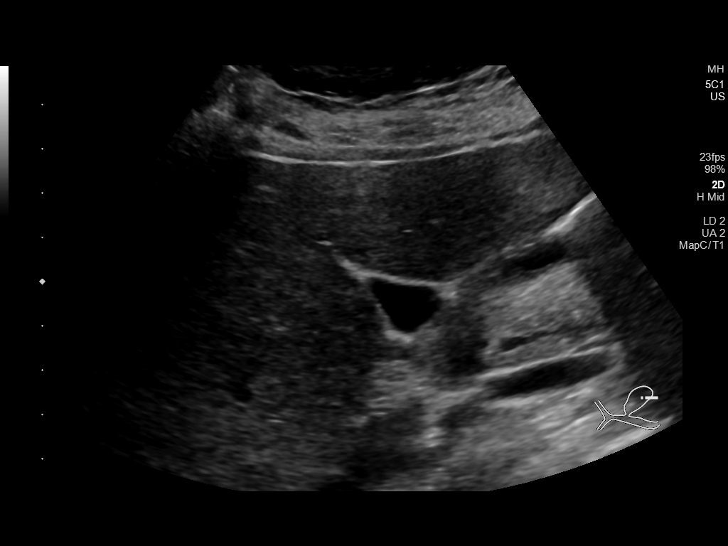
[im 22/127]
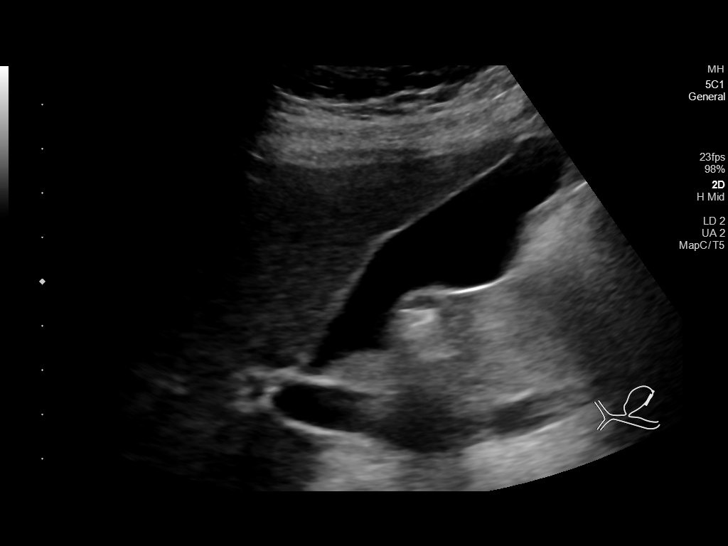
[im 32/127]
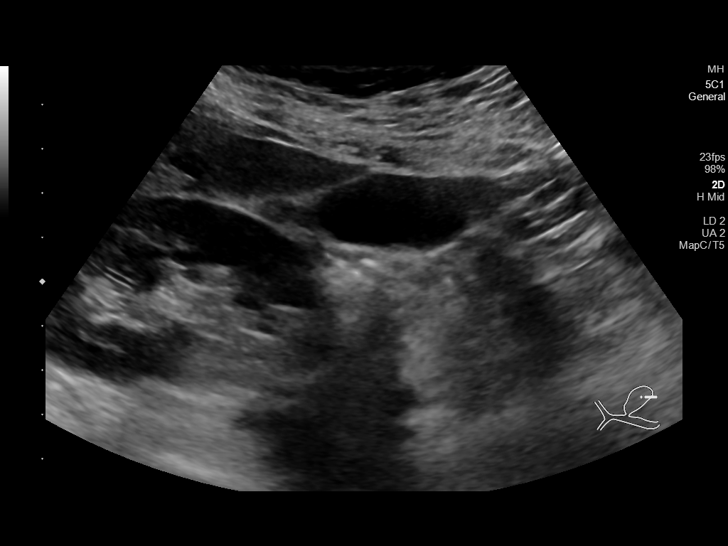
[im 43/127]
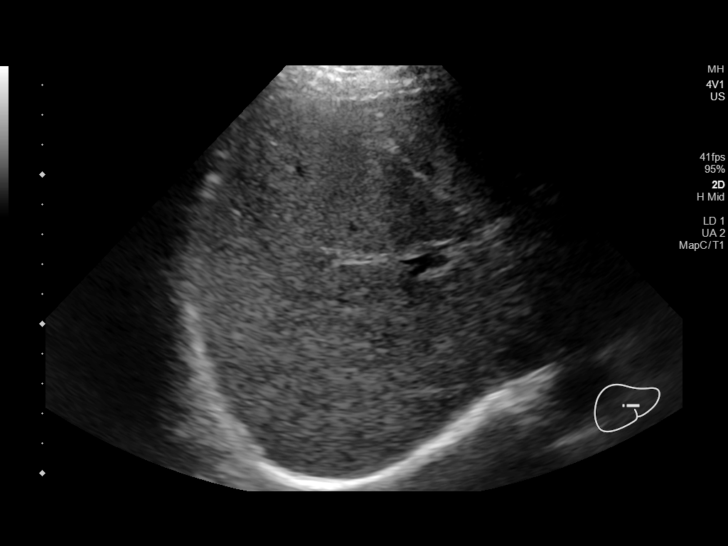
[im 48/127]
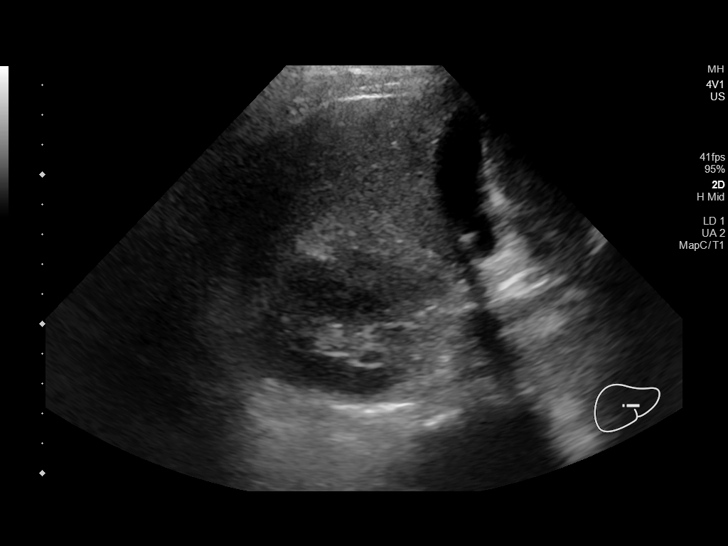
[im 58/127]
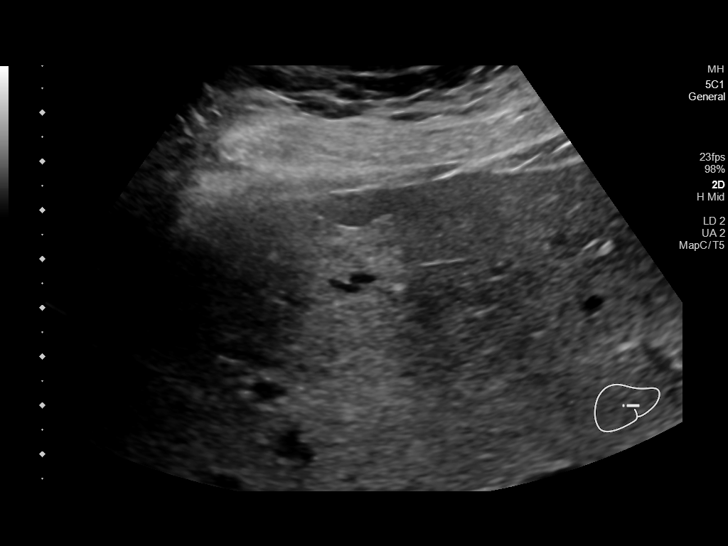
[im 69/127]
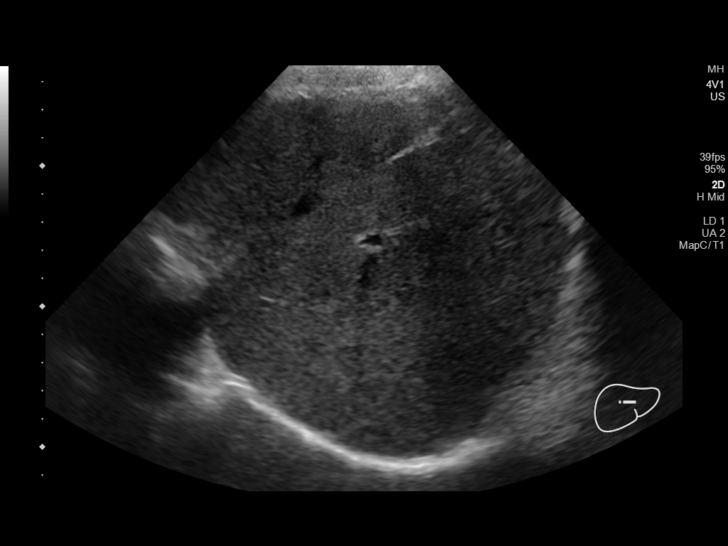
[im 79/127]
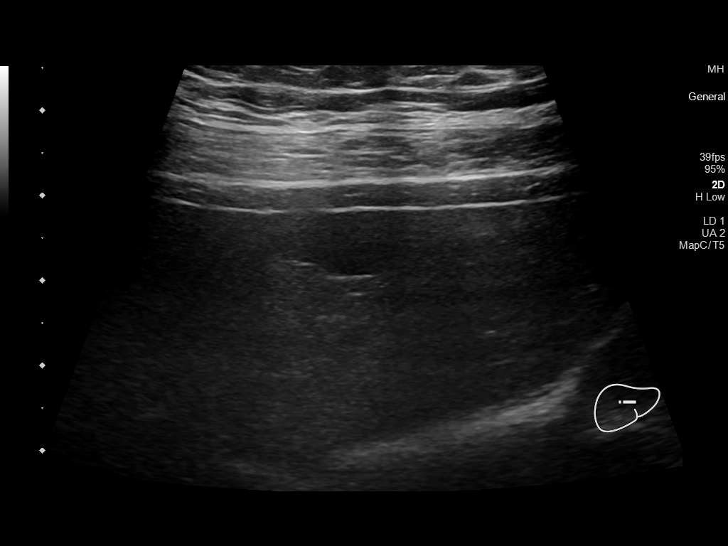
[im 85/127]
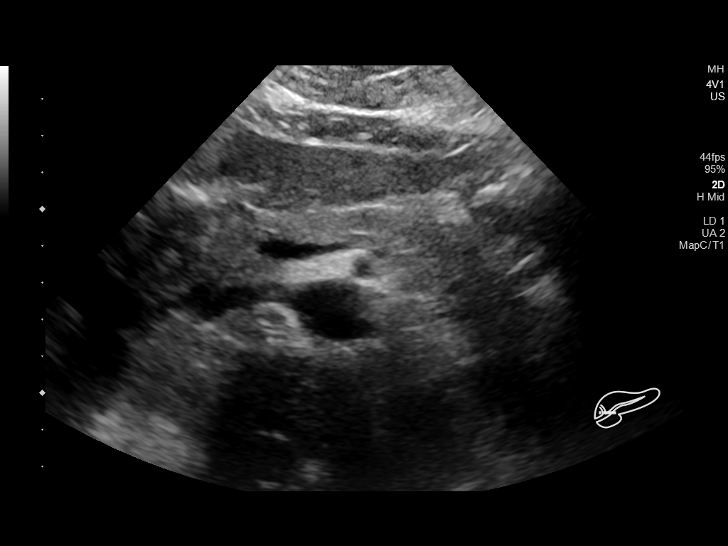
[im 95/127]
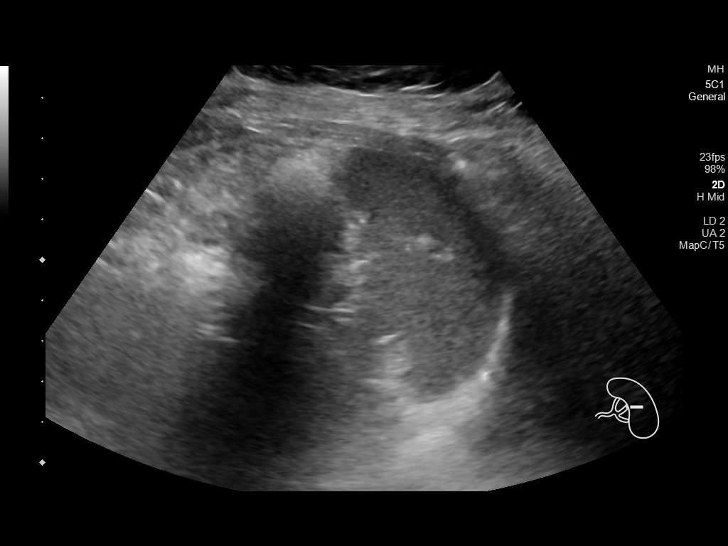
[im 106/127]
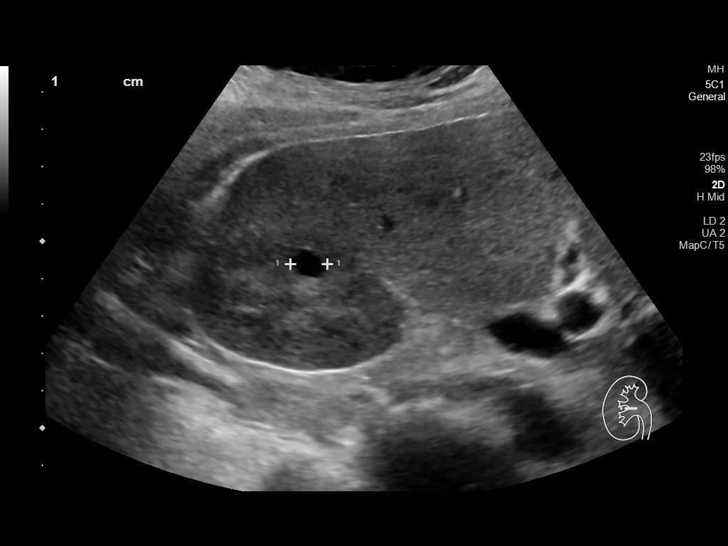
[im 116/127]
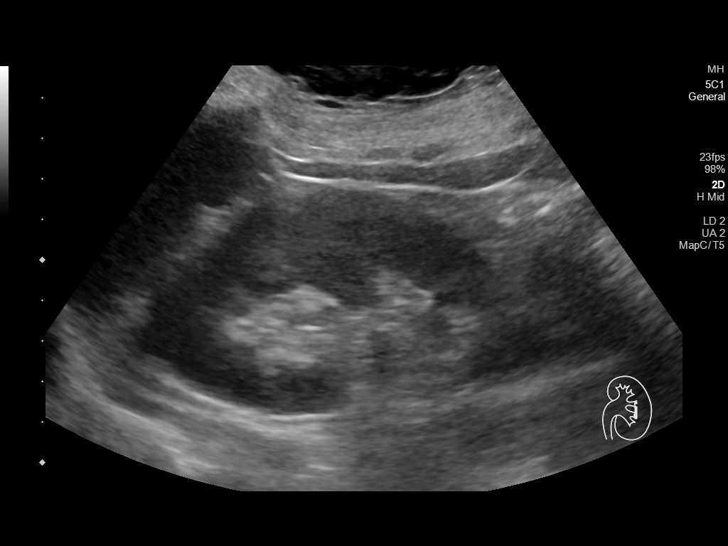
[im 127/127]
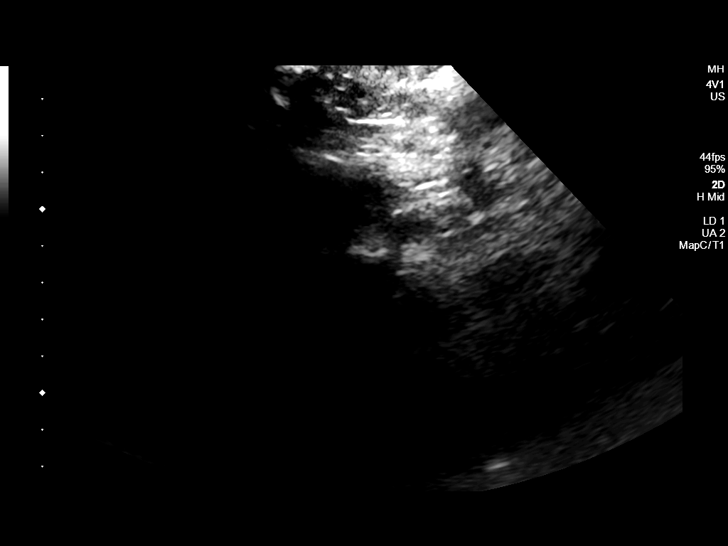

[14 of 25 positions shown; findings below may reference images not displayed]

FINDINGS: Gallbladder: Normally distended without stones or wall thickening.
No pericholecystic fluid or sonographic Murphy sign.

Common bile duct: Diameter: 4 mm, normal

Liver: Normal hepatic parenchymal echogenicity. Cystic lesion
identified anteriorly within liver 12 x 7 x 14 mm in size question
cyst. No additional hepatic masses or nodularity. Portal vein is
patent on color Doppler imaging with normal direction of blood flow
towards the liver.

IVC: Normal appearance

Pancreas: Normal appearance

Spleen: Normal appearance, 4.7 cm length

Right Kidney: Length: 11.2 cm. Normal cortical thickness and
echogenicity. Small cyst at upper pole 13 x 8 x 10 mm. No additional
mass or hydronephrosis.

Left Kidney: Length: 11.8 cm. Normal morphology without mass or
hydronephrosis.

Abdominal aorta: Normal caliber

Other findings: No free fluid
IMPRESSION: Small hepatic and RIGHT renal cysts.

Otherwise negative exam.

## 2021-01-07 ENCOUNTER — Encounter: Payer: Self-pay | Admitting: Nurse Practitioner

## 2021-01-07 ENCOUNTER — Other Ambulatory Visit: Payer: Self-pay

## 2021-01-07 ENCOUNTER — Ambulatory Visit (INDEPENDENT_AMBULATORY_CARE_PROVIDER_SITE_OTHER): Payer: 59 | Admitting: Nurse Practitioner

## 2021-01-07 VITALS — BP 110/72 | HR 68 | Temp 96.8°F | Ht 64.0 in | Wt 158.8 lb

## 2021-01-07 DIAGNOSIS — I10 Essential (primary) hypertension: Secondary | ICD-10-CM | POA: Diagnosis not present

## 2021-01-07 DIAGNOSIS — Z780 Asymptomatic menopausal state: Secondary | ICD-10-CM | POA: Diagnosis not present

## 2021-01-07 DIAGNOSIS — F419 Anxiety disorder, unspecified: Secondary | ICD-10-CM | POA: Diagnosis not present

## 2021-01-07 DIAGNOSIS — Z6827 Body mass index (BMI) 27.0-27.9, adult: Secondary | ICD-10-CM

## 2021-01-07 DIAGNOSIS — E663 Overweight: Secondary | ICD-10-CM

## 2021-01-07 DIAGNOSIS — F5101 Primary insomnia: Secondary | ICD-10-CM

## 2021-01-07 DIAGNOSIS — E785 Hyperlipidemia, unspecified: Secondary | ICD-10-CM | POA: Diagnosis not present

## 2021-01-07 DIAGNOSIS — R7989 Other specified abnormal findings of blood chemistry: Secondary | ICD-10-CM

## 2021-01-07 MED ORDER — ESCITALOPRAM OXALATE 10 MG PO TABS: 10.0000 mg | ORAL_TABLET | Freq: Every day | ORAL | 1 refills | Status: DC

## 2021-01-07 NOTE — Patient Instructions (Signed)
To start lexapro- 1/2 tablet by mouth daily (can take in am or pm) for 1 week then increase to 1 tablet.  "body scanning" at night to help with sleep.   Get back on exercise 30 mins/ 5 days a week.

## 2021-01-07 NOTE — Progress Notes (Signed)
Careteam: Patient Care Team: Lauree Chandler, NP as PCP - General (Geriatric Medicine) Janne Napoleon, PA-C (Inactive) (Dermatology) Loney Loh, MD (Dermatology) Everlene Farrier, MD as Consulting Physician (Obstetrics and Gynecology)  PLACE OF SERVICE:  [place] Directive information Does Patient Have a Medical Advance Directive?: No, Does patient want to make changes to medical advance directive?: Yes (MAU/Ambulatory/Procedural Areas - Information given)  Allergies  Allergen Reactions  . Penicillin G Potassium In D5w     hives  . Sulfa Antibiotics     hives    Chief Complaint  Patient presents with  . Medical Management of Chronic Issues    6 month follow-up. Patient plans to get Td/tdap and Shingrix at local pharmacy. Patient stopped paxil due to ineffectiveness and it made her sleepy during the day when she had to work. FYI, Ambien is prescribed by GYN.      HPI: Patient is a 55 y.o. female for routine follow up.   Anxiety- still there paxil made her too sleepy.   Chronic back pain- stable, a lot of the time in her legs. Doing well at this time. Years since epidural in her spine.  Has not needed gabapentin in several months  htn- well controlled on olmesartan-hctz  Insomnia- sleeping well on ambien- does not like to take it every night, will not take on weekends due to flexibility.  Hot flashes make it worse.   Not doing any exercise. Got some hand weights.   Review of Systems:  Review of Systems  Constitutional: Negative for chills, fever and weight loss.  HENT: Negative for tinnitus.   Respiratory: Negative for cough, sputum production and shortness of breath.   Cardiovascular: Negative for chest pain, palpitations and leg swelling.  Gastrointestinal: Negative for abdominal pain, constipation, diarrhea and heartburn.  Genitourinary: Negative for dysuria, frequency and urgency.  Musculoskeletal: Negative for back pain, falls,  joint pain and myalgias.  Skin: Negative.   Neurological: Negative for dizziness and headaches.  Psychiatric/Behavioral: Negative for depression and memory loss. The patient is nervous/anxious and has insomnia.     Past Medical History:  Diagnosis Date  . Allergy   . Anemia   . Diverticulosis   . GERD (gastroesophageal reflux disease)   . High blood pressure   . Leg pain   . Numbness   . Perimenopause   . Tubular adenoma of colon    Past Surgical History:  Procedure Laterality Date  . BREAST EXCISIONAL BIOPSY Left    30 years ago; benign  . Cyst removed from Left Breast  1984  . DILATION AND CURETTAGE OF UTERUS     Uterine abalation  . TUBAL LIGATION    . WISDOM TOOTH EXTRACTION     Social History:   reports that she has never smoked. She has never used smokeless tobacco. She reports current alcohol use of about 6.0 standard drinks of alcohol per week. She reports that she does not use drugs.  Family History  Problem Relation Age of Onset  . Cancer Father        lung cancer  . Hypertension Father   . Hypertension Mother   . Arthritis Mother   . Cancer Maternal Grandmother   . COPD Paternal 56   . Alzheimer's disease Paternal Grandmother   . Colon cancer Neg Hx   . Esophageal cancer Neg Hx   . Liver cancer Neg Hx   . Pancreatic cancer Neg Hx   . Rectal cancer Neg Hx   .  Stomach cancer Neg Hx     Medications: Patient's Medications  New Prescriptions   No medications on file  Previous Medications   ASCORBIC ACID (VITAMIN C PO)    Take 1 tablet by mouth daily.   ASPIRIN-ACETAMINOPHEN-CAFFEINE (GOODYS EXTRA STRENGTH PO)    Take 1 packet by mouth as needed (For headache).   CETIRIZINE (ZYRTEC) 10 MG TABLET    Take 10 mg by mouth daily.   CHOLECALCIFEROL (VITAMIN D3) 2000 UNITS TABS    Take 2,000 Units by mouth daily.   FLUTICASONE (FLONASE) 50 MCG/ACT NASAL SPRAY    SHAKE LIQUID AND USE 1 SPRAY IN EACH NOSTRIL DAILY   GABAPENTIN (NEURONTIN) 300 MG CAPSULE     Take 300 mg by mouth 3 (three) times daily as needed.   IBUPROFEN (ADVIL) 800 MG TABLET    TAKE 1 TABLET(800 MG) BY MOUTH DAILY AS NEEDED FOR MILD PAIN OR MODERATE PAIN   MONTELUKAST (SINGULAIR) 10 MG TABLET    Take 10 mg by mouth at bedtime.    OLMESARTAN-HYDROCHLOROTHIAZIDE (BENICAR HCT) 40-25 MG TABLET    TAKE 1 TABLET DAILY   TACROLIMUS (PROTOPIC) 0.1 % OINTMENT    Apply topically daily.   ZOLPIDEM (AMBIEN) 5 MG TABLET    Take 5 mg by mouth at bedtime as needed.  Modified Medications   No medications on file  Discontinued Medications   GABAPENTIN (NEURONTIN) 300 MG CAPSULE    Take 1 capsule (300 mg total) by mouth 3 (three) times daily.   PAROXETINE (PAXIL) 10 MG TABLET    TAKE 1 TABLET(10 MG) BY MOUTH DAILY    Physical Exam:  Vitals:   01/07/21 0833  BP: 110/72  Pulse: 68  Temp: (!) 96.8 F (36 C)  TempSrc: Temporal  SpO2: 99%  Weight: 158 lb 12.8 oz (72 kg)  Height: 5\' 4"  (1.626 m)   Body mass index is 27.26 kg/m. Wt Readings from Last 3 Encounters:  01/07/21 158 lb 12.8 oz (72 kg)  05/26/20 160 lb (72.6 kg)  01/03/20 163 lb 2 oz (74 kg)    Physical Exam Constitutional:      General: She is not in acute distress.    Appearance: She is well-developed. She is not diaphoretic.  HENT:     Head: Normocephalic and atraumatic.     Mouth/Throat:     Pharynx: No oropharyngeal exudate.  Eyes:     Conjunctiva/sclera: Conjunctivae normal.     Pupils: Pupils are equal, round, and reactive to light.  Cardiovascular:     Rate and Rhythm: Normal rate and regular rhythm.     Heart sounds: Normal heart sounds.  Pulmonary:     Effort: Pulmonary effort is normal.     Breath sounds: Normal breath sounds.  Abdominal:     General: Bowel sounds are normal.     Palpations: Abdomen is soft.  Musculoskeletal:        General: No tenderness.     Cervical back: Normal range of motion and neck supple.  Skin:    General: Skin is warm and dry.  Neurological:     Mental Status: She is  alert and oriented to person, place, and time.  Psychiatric:        Mood and Affect: Mood normal.        Behavior: Behavior normal.     Labs reviewed: Basic Metabolic Panel: Recent Labs    05/26/20 0919  NA 139  K 3.8  CL 101  CO2 24  GLUCOSE  73  BUN 17  CREATININE 0.62  CALCIUM 10.0  TSH 0.38*   Liver Function Tests: Recent Labs    05/26/20 0919  AST 22  ALT 21  BILITOT 0.6  PROT 8.3*   No results for input(s): LIPASE, AMYLASE in the last 8760 hours. No results for input(s): AMMONIA in the last 8760 hours. CBC: No results for input(s): WBC, NEUTROABS, HGB, HCT, MCV, PLT in the last 8760 hours. Lipid Panel: Recent Labs    05/26/20 0919  CHOL 213*  HDL 103  LDLCALC 93  TRIG 84  CHOLHDL 2.1   TSH: Recent Labs    05/26/20 0919  TSH 0.38*   A1C: Lab Results  Component Value Date   HGBA1C 5.3 06/19/2014     Assessment/Plan 1. Anxiety Ongoing, paxil made her too lethargic. To start lexapro half tablet for 1 week then increase to whole.  - escitalopram (LEXAPRO) 10 MG tablet; Take 1 tablet (10 mg total) by mouth daily.  Dispense: 30 tablet; Refill: 1  2. Benign essential HTN -well controlled on current regimen, continue low sodium diet.  - CBC with Differential/Platelet - COMPLETE METABOLIC PANEL WITH GFR  3. Postmenopausal -ongoing, stable symptoms.  4. Hyperlipidemia LDL goal <100 -continue dietary modifications.  5. Primary insomnia -continues ambien PRN, handouts given to help with sleep promotion  6. Low TSH level Follow up TSH  7. Overweight with body mass index (BMI) 25.0-29.9 -education provided for healthy weight though diet and exercise.   8. Body mass index 27.0-27.9, adult -educated on diet and exercise for healthy weight loss.  Next appt: 6 months.  Carlos American. [place], [facility] [phone]

## 2021-01-08 LAB — COMPLETE METABOLIC PANEL WITH GFR
AG Ratio: 1.6 (calc) (ref 1.0–2.5)
ALT: 18 U/L (ref 6–29)
AST: 19 U/L (ref 10–35)
Albumin: 4.9 g/dL (ref 3.6–5.1)
Alkaline phosphatase (APISO): 52 U/L (ref 37–153)
BUN: 20 mg/dL (ref 7–25)
CO2: 30 mmol/L (ref 20–32)
Calcium: 10.5 mg/dL — ABNORMAL HIGH (ref 8.6–10.4)
Chloride: 99 mmol/L (ref 98–110)
Creat: 0.55 mg/dL (ref 0.50–1.05)
GFR, Est African American: 124 mL/min/{1.73_m2} (ref >=60)
GFR, Est Non African American: 107 mL/min/{1.73_m2} (ref >=60)
Globulin: 3 g/dL (calc) (ref 1.9–3.7)
Glucose, Bld: 85 mg/dL (ref 65–99)
Potassium: 3.9 mmol/L (ref 3.5–5.3)
Sodium: 140 mmol/L (ref 135–146)
Total Bilirubin: 1 mg/dL (ref 0.2–1.2)
Total Protein: 7.9 g/dL (ref 6.1–8.1)

## 2021-01-08 LAB — CBC WITH DIFFERENTIAL/PLATELET
Absolute Monocytes: 424 cells/uL (ref 200–950)
Basophils Absolute: 20 cells/uL (ref 0–200)
Basophils Relative: 0.5 %
Eosinophils Absolute: 32 cells/uL (ref 15–500)
Eosinophils Relative: 0.8 %
HCT: 36.6 % (ref 35.0–45.0)
Hemoglobin: 12.5 g/dL (ref 11.7–15.5)
Lymphs Abs: 1476 cells/uL (ref 850–3900)
MCH: 34 pg — ABNORMAL HIGH (ref 27.0–33.0)
MCHC: 34.2 g/dL (ref 32.0–36.0)
MCV: 99.5 fL (ref 80.0–100.0)
MPV: 10.2 fL (ref 7.5–12.5)
Monocytes Relative: 10.6 %
Neutro Abs: 2048 cells/uL (ref 1500–7800)
Neutrophils Relative %: 51.2 %
Platelets: 310 10*3/uL (ref 140–400)
RBC: 3.68 10*6/uL — ABNORMAL LOW (ref 3.80–5.10)
RDW: 11.8 % (ref 11.0–15.0)
Total Lymphocyte: 36.9 %
WBC: 4 10*3/uL (ref 3.8–10.8)

## 2021-01-08 LAB — TSH: TSH: 1.06 mIU/L

## 2021-01-26 ENCOUNTER — Other Ambulatory Visit: Payer: Self-pay | Admitting: Internal Medicine

## 2021-01-26 NOTE — Telephone Encounter (Signed)
Spoke with patient and she is under Lauree Chandler, NP care

## 2021-02-05 ENCOUNTER — Telehealth (INDEPENDENT_AMBULATORY_CARE_PROVIDER_SITE_OTHER): Payer: 59 | Admitting: Nurse Practitioner

## 2021-02-05 ENCOUNTER — Other Ambulatory Visit: Payer: Self-pay

## 2021-02-05 DIAGNOSIS — F419 Anxiety disorder, unspecified: Secondary | ICD-10-CM

## 2021-02-05 DIAGNOSIS — J301 Allergic rhinitis due to pollen: Secondary | ICD-10-CM | POA: Diagnosis not present

## 2021-02-05 MED ORDER — MONTELUKAST SODIUM 10 MG PO TABS: 10.0000 mg | ORAL_TABLET | Freq: Every day | ORAL | 1 refills | Status: DC

## 2021-02-05 MED ORDER — ESCITALOPRAM OXALATE 20 MG PO TABS: 20.0000 mg | ORAL_TABLET | Freq: Every day | ORAL | 1 refills | Status: DC

## 2021-02-05 MED ORDER — FLUTICASONE PROPIONATE 50 MCG/ACT NA SUSP
NASAL | 3 refills | Status: AC
Start: 2021-02-05 — End: ?

## 2021-02-05 NOTE — Progress Notes (Signed)
This service is provided via telemedicine  No vital signs collected/recorded due to the encounter was a telemedicine visit.   Location of patient (ex: home, work): Home  Patient consents to a telephone visit:  Yes, see encounter dated 07/04/2020  Location of the provider (ex: office, home):  [facility]  Name of any referring provider: N/A  Names of all persons participating in the telemedicine service and their role in the encounter:  Sherrie Mustache, Nurse Practitioner, Carroll Kinds, CMA, and patient.   Time spent on call: 10 minutes with medical assistant

## 2021-02-05 NOTE — Progress Notes (Addendum)
Careteam: Patient Care Team: Lauree Chandler, NP as PCP - General (Geriatric Medicine) Janne Napoleon, PA-C (Inactive) (Dermatology) Loney Loh, MD (Dermatology) Everlene Farrier, MD as Consulting Physician (Obstetrics and Gynecology)  Advanced Directive information Does Patient Have a Medical Advance Directive?: No  Allergies  Allergen Reactions  . Penicillin G Potassium In D5w     hives  . Sulfa Antibiotics     hives    Chief Complaint  Patient presents with  . Follow-up    4 week follow up. Patient following up on new anxiety medication given the lexapro. Patient states that it doesn't make her drowsy. Patient doesn't think it seems to be making a difference.      HPI: Patient is a 55 y.o. female to follow up anxiety. She was started on lexapro but continues to have 20,000 things going on in her mind.  She takes medication at night.  Does not make her drowsy during the day.  Unsure if it is helping.  She did go to mindfulness meditation retreat last year and had good tips and tricks for coping with anxiety.   Has a fever 2 days ago. Better over the last few days.  Review of Systems:  Review of Systems  Constitutional: Positive for malaise/fatigue. Negative for chills, fever and weight loss.  HENT: Negative for tinnitus.   Respiratory: Negative for cough, sputum production and shortness of breath.   Cardiovascular: Negative for chest pain, palpitations and leg swelling.  Gastrointestinal: Negative for abdominal pain, constipation, diarrhea and heartburn.  Musculoskeletal: Negative for back pain, falls, joint pain and myalgias.  Skin: Negative.   Neurological: Negative for dizziness and headaches.  Psychiatric/Behavioral: Negative for depression and memory loss. The patient is nervous/anxious. The patient does not have insomnia.     Past Medical History:  Diagnosis Date  . Allergy   . Anemia   . Diverticulosis   . GERD (gastroesophageal reflux  disease)   . High blood pressure   . Leg pain   . Numbness   . Perimenopause   . Tubular adenoma of colon    Past Surgical History:  Procedure Laterality Date  . BREAST EXCISIONAL BIOPSY Left    30 years ago; benign  . Cyst removed from Left Breast  1984  . DILATION AND CURETTAGE OF UTERUS     Uterine abalation  . TUBAL LIGATION    . WISDOM TOOTH EXTRACTION     Social History:   reports that she has never smoked. She has never used smokeless tobacco. She reports current alcohol use of about 6.0 standard drinks of alcohol per week. She reports that she does not use drugs.  Family History  Problem Relation Age of Onset  . Cancer Father        lung cancer  . Hypertension Father   . Hypertension Mother   . Arthritis Mother   . Cancer Maternal Grandmother   . COPD Paternal 67   . Alzheimer's disease Paternal Grandmother   . Colon cancer Neg Hx   . Esophageal cancer Neg Hx   . Liver cancer Neg Hx   . Pancreatic cancer Neg Hx   . Rectal cancer Neg Hx   . Stomach cancer Neg Hx     Medications: Patient's Medications  New Prescriptions   No medications on file  Previous Medications   ACETAMINOPHEN (TYLENOL) 500 MG TABLET    Take 500 mg by mouth daily. Patient takes it if not taking Goodys powder  ASCORBIC ACID (VITAMIN C PO)    Take 1 tablet by mouth daily.   ASPIRIN-ACETAMINOPHEN-CAFFEINE (GOODYS EXTRA STRENGTH PO)    Take 1 packet by mouth as needed (For headache).   CETIRIZINE (ZYRTEC) 10 MG TABLET    Take 10 mg by mouth daily.   CHOLECALCIFEROL (VITAMIN D3) 2000 UNITS TABS    Take 2,000 Units by mouth daily.   ESCITALOPRAM (LEXAPRO) 10 MG TABLET    Take 1 tablet (10 mg total) by mouth daily.   FLUTICASONE (FLONASE) 50 MCG/ACT NASAL SPRAY    SHAKE LIQUID AND USE 1 SPRAY IN EACH NOSTRIL DAILY   GABAPENTIN (NEURONTIN) 300 MG CAPSULE    Take 300 mg by mouth 3 (three) times daily as needed.   IBUPROFEN (ADVIL) 800 MG TABLET    TAKE 1 TABLET(800 MG) BY MOUTH DAILY AS NEEDED  FOR MILD PAIN OR MODERATE PAIN   MONTELUKAST (SINGULAIR) 10 MG TABLET    Take 10 mg by mouth at bedtime.    OLMESARTAN-HYDROCHLOROTHIAZIDE (BENICAR HCT) 40-25 MG TABLET    TAKE 1 TABLET DAILY   TACROLIMUS (PROTOPIC) 0.1 % OINTMENT    Apply topically as needed.   ZOLPIDEM (AMBIEN) 5 MG TABLET    Take 5 mg by mouth at bedtime as needed.  Modified Medications   No medications on file  Discontinued Medications   No medications on file    Physical Exam:  There were no vitals filed for this visit. There is no height or weight on file to calculate BMI. Wt Readings from Last 3 Encounters:  01/07/21 158 lb 12.8 oz (72 kg)  05/26/20 160 lb (72.6 kg)  01/03/20 163 lb 2 oz (74 kg)    Physical Exam Constitutional:      Appearance: Normal appearance.  Neurological:     Mental Status: She is alert.  Psychiatric:        Mood and Affect: Mood normal.        Behavior: Behavior normal.     Labs reviewed: Basic Metabolic Panel: Recent Labs    05/26/20 0919 01/07/21 0909  NA 139 140  K 3.8 3.9  CL 101 99  CO2 24 30  GLUCOSE 73 85  BUN 17 20  CREATININE 0.62 0.55  CALCIUM 10.0 10.5*  TSH 0.38* 1.06   Liver Function Tests: Recent Labs    05/26/20 0919 01/07/21 0909  AST 22 19  ALT 21 18  BILITOT 0.6 1.0  PROT 8.3* 7.9   No results for input(s): LIPASE, AMYLASE in the last 8760 hours. No results for input(s): AMMONIA in the last 8760 hours. CBC: Recent Labs    01/07/21 0909  WBC 4.0  NEUTROABS 2,048  HGB 12.5  HCT 36.6  MCV 99.5  PLT 310   Lipid Panel: Recent Labs    05/26/20 0919  CHOL 213*  HDL 103  LDLCALC 93  TRIG 84  CHOLHDL 2.1   TSH: Recent Labs    05/26/20 0919 01/07/21 0909  TSH 0.38* 1.06   A1C: Lab Results  Component Value Date   HGBA1C 5.3 06/19/2014     Assessment/Plan 1. Seasonal allergic rhinitis due to pollen -worse due to allergy season - fluticasone (FLONASE) 50 MCG/ACT nasal spray; SHAKE LIQUID AND USE 1 SPRAY IN EACH  NOSTRIL DAILY  Dispense: 16 g; Refill: 3 - montelukast (SINGULAIR) 10 MG tablet; Take 1 tablet (10 mg total) by mouth at bedtime.  Dispense: 90 tablet; Refill: 1  2. Anxiety -ongoing, will increase lexapro at this  time to 20 mg daily. She will also do lifestyle modifications to help mange anxiety.   - escitalopram (LEXAPRO) 20 MG tablet; Take 1 tablet (20 mg total) by mouth daily.  Dispense: 30 tablet; Refill: 1   Next appt: 6 months  Josafat Enrico K. [place], [facility] [phone]    Virtual Visit via my chart I connected with patient on 02/05/21 at  8:30 AM EDT by video visit and verified that I am speaking with the correct person using two identifiers.  Location: Patient: home Provider: [place]   I discussed the limitations, risks, security and privacy concerns of performing an evaluation and management service by telephone and the availability of in person appointments. I also discussed with the patient that there may be a patient responsible charge related to this service. The patient expressed understanding and agreed to proceed.   I discussed the assessment and treatment plan with the patient. The patient was provided an opportunity to ask questions and all were answered. The patient agreed with the plan and demonstrated an understanding of the instructions.   The patient was advised to call back or seek an in-person evaluation if the symptoms worsen or if the condition fails to improve as anticipated.  I provided 17 minutes of non-face-to-face time during this encounter.  Carlos American. Harle Battiest Avs printed and mailed

## 2021-07-18 ENCOUNTER — Other Ambulatory Visit: Payer: Self-pay | Admitting: Nurse Practitioner

## 2021-07-18 DIAGNOSIS — F419 Anxiety disorder, unspecified: Secondary | ICD-10-CM

## 2021-11-15 ENCOUNTER — Other Ambulatory Visit: Payer: Self-pay | Admitting: Nurse Practitioner

## 2021-12-16 ENCOUNTER — Other Ambulatory Visit: Payer: Self-pay | Admitting: Nurse Practitioner

## 2021-12-16 NOTE — Telephone Encounter (Signed)
Detailed message left informing patient she is overdue for a follow-up appointment. Patient instructed to call to schedule   RX approved for 30 day supply only to allow patient time to schedule.   Side note: Janett Billow has some on openings Monday 01/04/2022 for an in person visit

## 2021-12-28 ENCOUNTER — Ambulatory Visit (INDEPENDENT_AMBULATORY_CARE_PROVIDER_SITE_OTHER): Payer: 59 | Admitting: Nurse Practitioner

## 2021-12-28 ENCOUNTER — Encounter: Payer: Self-pay | Admitting: Nurse Practitioner

## 2021-12-28 ENCOUNTER — Other Ambulatory Visit: Payer: Self-pay

## 2021-12-28 VITALS — BP 126/80 | HR 75 | Temp 97.6°F | Ht 63.03 in | Wt 162.0 lb

## 2021-12-28 DIAGNOSIS — J301 Allergic rhinitis due to pollen: Secondary | ICD-10-CM | POA: Diagnosis not present

## 2021-12-28 DIAGNOSIS — Z780 Asymptomatic menopausal state: Secondary | ICD-10-CM

## 2021-12-28 DIAGNOSIS — E785 Hyperlipidemia, unspecified: Secondary | ICD-10-CM

## 2021-12-28 DIAGNOSIS — I10 Essential (primary) hypertension: Secondary | ICD-10-CM | POA: Diagnosis not present

## 2021-12-28 DIAGNOSIS — F419 Anxiety disorder, unspecified: Secondary | ICD-10-CM

## 2021-12-28 DIAGNOSIS — E663 Overweight: Secondary | ICD-10-CM

## 2021-12-28 DIAGNOSIS — F5101 Primary insomnia: Secondary | ICD-10-CM

## 2021-12-28 MED ORDER — MONTELUKAST SODIUM 10 MG PO TABS: 10.0000 mg | ORAL_TABLET | Freq: Every day | ORAL | 1 refills | Status: DC

## 2021-12-28 NOTE — Progress Notes (Signed)
? ? ?Careteam: ?Patient Care Team: ?Lauree Chandler, NP as PCP - General (Geriatric Medicine) ?Clark-Burning, Anderson Malta, PA-C (Inactive) (Dermatology) ?Loney Loh, MD (Dermatology) ?Everlene Farrier, MD as Consulting Physician (Obstetrics and Gynecology) ? ?PLACE OF SERVICE:  ?[facility]  ?Advanced Directive information ?Does Patient Have a Medical Advance Directive?: No, Would patient like information on creating a medical advance directive?: Yes (MAU/Ambulatory/Procedural Areas - Information given) ? ?Allergies  ?Allergen Reactions  ? Penicillin G Potassium In D5w   ?  hives  ? Sulfa Antibiotics   ?  hives  ? ? ?Chief Complaint  ?Patient presents with  ? Medical Management of Chronic Issues  ?  Yearly routine visit. Refill Singulair.  ? ? ? ?HPI: Patient is a 56 y.o. female for routine follow up.  ? ?March 1st her insurance kicked in- was out of work for a while.  ?Trying to get caught up on appts.  ? ?Anxiety- stopped lexapro, was able to take time between jobs. ? ?Allergies- starting up. Needs refill on singulair ? ?Harder to get weight off. Has not been exercising.  ? ?Post-menopausal- continues with hot flashes, sleep is not the best.  ?Ambien helps for sleep Talking far less than she has before. ? ? ?Review of Systems:  ?Review of Systems  ?Constitutional:  Negative for chills, fever and weight loss.  ?HENT:  Negative for tinnitus.   ?Respiratory:  Negative for cough, sputum production and shortness of breath.   ?Cardiovascular:  Negative for chest pain, palpitations and leg swelling.  ?Gastrointestinal:  Negative for abdominal pain, constipation, diarrhea and heartburn.  ?Genitourinary:  Negative for dysuria, frequency and urgency.  ?Musculoskeletal:  Negative for back pain, falls, joint pain and myalgias.  ?Skin: Negative.   ?Neurological:  Negative for dizziness and headaches.  ?Psychiatric/Behavioral:  Negative for depression and memory loss. The patient does not have insomnia.   ? ?Past Medical  History:  ?Diagnosis Date  ? Allergy   ? Anemia   ? Diverticulosis   ? GERD (gastroesophageal reflux disease)   ? High blood pressure   ? Leg pain   ? Numbness   ? Perimenopause   ? Tubular adenoma of colon   ? ?Past Surgical History:  ?Procedure Laterality Date  ? BREAST EXCISIONAL BIOPSY Left   ? 30 years ago; benign  ? Cyst removed from Left Breast  1984  ? DILATION AND CURETTAGE OF UTERUS    ? Uterine abalation  ? TUBAL LIGATION    ? WISDOM TOOTH EXTRACTION    ? ?Social History: ?  reports that she has never smoked. She has never used smokeless tobacco. She reports current alcohol use of about 6.0 standard drinks per week. She reports that she does not use drugs. ? ?Family History  ?Problem Relation Age of Onset  ? Cancer Father   ?     lung cancer  ? Hypertension Father   ? Hypertension Mother   ? Arthritis Mother   ? Cancer Maternal Grandmother   ? COPD Paternal Aunt   ? Alzheimer's disease Paternal Grandmother   ? Colon cancer Neg Hx   ? Esophageal cancer Neg Hx   ? Liver cancer Neg Hx   ? Pancreatic cancer Neg Hx   ? Rectal cancer Neg Hx   ? Stomach cancer Neg Hx   ? ? ?Medications: ?Patient's Medications  ?New Prescriptions  ? No medications on file  ?Previous Medications  ? ACETAMINOPHEN (TYLENOL) 500 MG TABLET    Take 500 mg  by mouth daily. Patient takes it if not taking Goodys powder  ? ASCORBIC ACID (VITAMIN C PO)    Take 1 tablet by mouth daily.  ? ASPIRIN-ACETAMINOPHEN-CAFFEINE (GOODYS EXTRA STRENGTH PO)    Take 1 packet by mouth as needed (For headache).  ? CETIRIZINE (ZYRTEC) 10 MG TABLET    Take 10 mg by mouth daily.  ? CHOLECALCIFEROL (VITAMIN D3) 2000 UNITS TABS    Take 2,000 Units by mouth daily.  ? FLUTICASONE (FLONASE) 50 MCG/ACT NASAL SPRAY    SHAKE LIQUID AND USE 1 SPRAY IN EACH NOSTRIL DAILY  ? IBUPROFEN (ADVIL) 800 MG TABLET    TAKE 1 TABLET(800 MG) BY MOUTH DAILY AS NEEDED FOR MILD PAIN OR MODERATE PAIN  ? MONTELUKAST (SINGULAIR) 10 MG TABLET    Take 1 tablet (10 mg total) by mouth at  bedtime.  ? OLMESARTAN-HYDROCHLOROTHIAZIDE (BENICAR HCT) 40-25 MG TABLET    TAKE 1 TABLET BY MOUTH EVERY DAY  ? TACROLIMUS (PROTOPIC) 0.1 % OINTMENT    Apply topically as needed.  ? UNABLE TO FIND    Med Name: Neurvia Plus 1 capsule daily for brain function  ? ZOLPIDEM (AMBIEN) 5 MG TABLET    Take 5 mg by mouth at bedtime as needed.  ?Modified Medications  ? No medications on file  ?Discontinued Medications  ? ESCITALOPRAM (LEXAPRO) 20 MG TABLET    Take 1 tablet (20 mg total) by mouth daily.  ? GABAPENTIN (NEURONTIN) 300 MG CAPSULE    Take 300 mg by mouth 3 (three) times daily as needed.  ? ? ?Physical Exam: ? ?Vitals:  ? 12/28/21 1614  ?BP: 126/80  ?Pulse: 75  ?Temp: 97.6 ?F (36.4 ?C)  ?TempSrc: Temporal  ?SpO2: 99%  ?Weight: 162 lb (73.5 kg)  ?Height: 5' 3.03" (1.601 m)  ? ?Body mass index is 28.67 kg/m?. ?Wt Readings from Last 3 Encounters:  ?12/28/21 162 lb (73.5 kg)  ?01/07/21 158 lb 12.8 oz (72 kg)  ?05/26/20 160 lb (72.6 kg)  ? ? ?Physical Exam ?Constitutional:   ?   General: She is not in acute distress. ?   Appearance: She is well-developed. She is not diaphoretic.  ?HENT:  ?   Head: Normocephalic and atraumatic.  ?   Right Ear: Tympanic membrane, ear canal and external ear normal.  ?   Left Ear: Tympanic membrane, ear canal and external ear normal.  ?   Nose: Nose normal.  ?   Mouth/Throat:  ?   Pharynx: No oropharyngeal exudate.  ?Eyes:  ?   Conjunctiva/sclera: Conjunctivae normal.  ?   Pupils: Pupils are equal, round, and reactive to light.  ?Cardiovascular:  ?   Rate and Rhythm: Normal rate and regular rhythm.  ?   Heart sounds: Normal heart sounds.  ?Pulmonary:  ?   Effort: Pulmonary effort is normal.  ?   Breath sounds: Normal breath sounds.  ?Abdominal:  ?   General: Bowel sounds are normal.  ?   Palpations: Abdomen is soft.  ?Musculoskeletal:     ?   General: Normal range of motion.  ?   Cervical back: Normal range of motion and neck supple.  ?   Right lower leg: No edema.  ?   Left lower leg: No  edema.  ?Skin: ?   General: Skin is warm and dry.  ?Neurological:  ?   Mental Status: She is alert and oriented to person, place, and time. Mental status is at baseline.  ?Psychiatric:     ?     Mood and Affect: Mood normal.  ? ? ?Labs reviewed: ?Basic Metabolic Panel: ?Recent Labs  ?  01/07/21 ?0909  ?NA 140  ?K 3.9  ?CL 99  ?CO2 30  ?GLUCOSE 85  ?BUN 20  ?CREATININE 0.55  ?CALCIUM 10.5*  ?TSH 1.06  ? ?Liver Function Tests: ?Recent Labs  ?  01/07/21 ?0909  ?AST 19  ?ALT 18  ?BILITOT 1.0  ?PROT 7.9  ? ?No results for input(s): LIPASE, AMYLASE in the last 8760 hours. ?No results for input(s): AMMONIA in the last 8760 hours. ?CBC: ?Recent Labs  ?  01/07/21 ?0909  ?WBC 4.0  ?NEUTROABS 2,048  ?HGB 12.5  ?HCT 36.6  ?MCV 99.5  ?PLT 310  ? ?Lipid Panel: ?No results for input(s): CHOL, HDL, LDLCALC, TRIG, CHOLHDL, LDLDIRECT in the last 8760 hours. ?TSH: ?Recent Labs  ?  01/07/21 ?0909  ?TSH 1.06  ? ?A1C: ?Lab Results  ?Component Value Date  ? HGBA1C 5.3 06/19/2014  ? ? ? ?Assessment/Plan ?1. Anxiety ?-controlled off medication.  ? ?2. Benign essential HTN ?-Blood pressure well controlled ?Continue current medications ?Recheck metabolic panel ?- CMP with eGFR([facility]) ?- CBC with Differential/Platelet ? ?3. Hyperlipidemia LDL goal <100 ?-will follow up lab, continue dietary modifications.  ?- Lipid panel ?- CMP with eGFR([facility]) ? ?4. Seasonal allergic rhinitis due to pollen ?-continue on singulair, flonase ?-to use nasal wash daily  ?- montelukast (SINGULAIR) 10 MG tablet; Take 1 tablet (10 mg total) by mouth at bedtime.  Dispense: 90 tablet; Refill: 1 ? ?5. Postmenopausal ?Ongoing, continues with symptoms, manages with lifestyle modifications. ? ?6. Primary insomnia ?Continue healthy sleep habits, can use melatonin  ?-gets Ambien through GYN ? ?7. Overweight with body mass index (BMI) 25.0-29.9 ?--education provided on healthy weight loss through increase in physical activity and proper nutrition  ?` ?Return in about 6 months  (around 06/30/2022) for routine follow up. ?Jessica K. Eubanks, AGNP ? ?[facility] ?[phone]  ?

## 2021-12-28 NOTE — Patient Instructions (Signed)
Okay to do melatonin 1 mg by mouth daily at bedtime.  ?

## 2021-12-29 LAB — COMPLETE METABOLIC PANEL WITH GFR
AG Ratio: 1.4 (calc) (ref 1.0–2.5)
ALT: 22 U/L (ref 6–29)
AST: 24 U/L (ref 10–35)
Albumin: 4.7 g/dL (ref 3.6–5.1)
Alkaline phosphatase (APISO): 57 U/L (ref 37–153)
BUN: 25 mg/dL (ref 7–25)
CO2: 28 mmol/L (ref 20–32)
Calcium: 10.8 mg/dL — ABNORMAL HIGH (ref 8.6–10.4)
Chloride: 102 mmol/L (ref 98–110)
Creat: 0.68 mg/dL (ref 0.50–1.03)
Globulin: 3.4 g/dL (calc) (ref 1.9–3.7)
Glucose, Bld: 94 mg/dL (ref 65–139)
Potassium: 4.3 mmol/L (ref 3.5–5.3)
Sodium: 139 mmol/L (ref 135–146)
Total Bilirubin: 0.5 mg/dL (ref 0.2–1.2)
Total Protein: 8.1 g/dL (ref 6.1–8.1)
eGFR: 103 mL/min/{1.73_m2} (ref >=60)

## 2021-12-29 LAB — CBC WITH DIFFERENTIAL/PLATELET
Absolute Monocytes: 824 cells/uL (ref 200–950)
Basophils Absolute: 20 cells/uL (ref 0–200)
Basophils Relative: 0.3 %
Eosinophils Absolute: 40 cells/uL (ref 15–500)
Eosinophils Relative: 0.6 %
HCT: 35.8 % (ref 35.0–45.0)
Hemoglobin: 12.2 g/dL (ref 11.7–15.5)
Lymphs Abs: 2606 cells/uL (ref 850–3900)
MCH: 34.1 pg — ABNORMAL HIGH (ref 27.0–33.0)
MCHC: 34.1 g/dL (ref 32.0–36.0)
MCV: 100 fL (ref 80.0–100.0)
MPV: 10.2 fL (ref 7.5–12.5)
Monocytes Relative: 12.3 %
Neutro Abs: 3209 cells/uL (ref 1500–7800)
Neutrophils Relative %: 47.9 %
Platelets: 294 10*3/uL (ref 140–400)
RBC: 3.58 10*6/uL — ABNORMAL LOW (ref 3.80–5.10)
RDW: 12.4 % (ref 11.0–15.0)
Total Lymphocyte: 38.9 %
WBC: 6.7 10*3/uL (ref 3.8–10.8)

## 2021-12-29 LAB — LIPID PANEL
Cholesterol: 235 mg/dL — ABNORMAL HIGH (ref <200)
HDL: 102 mg/dL (ref >=50)
LDL Cholesterol (Calc): 116 mg/dL (calc) — ABNORMAL HIGH
Non-HDL Cholesterol (Calc): 133 mg/dL (calc) — ABNORMAL HIGH (ref <130)
Total CHOL/HDL Ratio: 2.3 (calc) (ref <5.0)
Triglycerides: 73 mg/dL (ref <150)

## 2022-01-10 ENCOUNTER — Other Ambulatory Visit: Payer: Self-pay | Admitting: Nurse Practitioner

## 2022-01-18 ENCOUNTER — Telehealth: Payer: Self-pay

## 2022-01-18 NOTE — Telephone Encounter (Signed)
Called and left message for patient to office to reschedule appointment due to Jessica's schedule change ?

## 2022-03-10 ENCOUNTER — Telehealth: Payer: Self-pay

## 2022-03-10 NOTE — Telephone Encounter (Signed)
Patient called requesting referral to neurologist. She hasn't seen neurologist in about 3 years and needs new referral. Patient states she has pinched nerve and her leg is bothering her again. Patient given appointment to see Sherrie Mustache, NP 03/17/22 ?

## 2022-03-17 ENCOUNTER — Encounter: Payer: Self-pay | Admitting: Nurse Practitioner

## 2022-03-17 ENCOUNTER — Telehealth: Payer: Self-pay

## 2022-03-17 ENCOUNTER — Ambulatory Visit (INDEPENDENT_AMBULATORY_CARE_PROVIDER_SITE_OTHER): Payer: 59 | Admitting: Nurse Practitioner

## 2022-03-17 VITALS — BP 120/83 | HR 91 | Temp 97.0°F | Ht 63.0 in | Wt 166.1 lb

## 2022-03-17 DIAGNOSIS — M5417 Radiculopathy, lumbosacral region: Secondary | ICD-10-CM | POA: Diagnosis not present

## 2022-03-17 DIAGNOSIS — J301 Allergic rhinitis due to pollen: Secondary | ICD-10-CM

## 2022-03-17 MED ORDER — OLMESARTAN MEDOXOMIL-HCTZ 40-25 MG PO TABS: 1.0000 | ORAL_TABLET | Freq: Every day | ORAL | 3 refills | Status: DC

## 2022-03-17 MED ORDER — MONTELUKAST SODIUM 10 MG PO TABS: 10.0000 mg | ORAL_TABLET | Freq: Every day | ORAL | 1 refills | Status: DC

## 2022-03-17 NOTE — Telephone Encounter (Signed)
Pharmacy requested refill

## 2022-03-17 NOTE — Progress Notes (Signed)
Careteam: Patient Care Team: Lauree Chandler, NP as PCP - General (Geriatric Medicine) Janne Napoleon, PA-C (Inactive) (Dermatology) Loney Loh, MD (Dermatology) Everlene Farrier, MD as Consulting Physician (Obstetrics and Gynecology)  PLACE OF SERVICE:  [facility]  Advanced Directive information    Allergies  Allergen Reactions   Penicillin G Potassium In D5w     hives   Sulfa Antibiotics     hives    Chief Complaint  Patient presents with   Acute Visit    Patient would like to discuss referral for neurology.Patient has not seen neurologist in 3 years and needs new referral. Patient states that right leg is starting to bother her again.Patient states she is having numbness just below knee that sometimes shoot downs toward foot or up toward thigh.     HPI: Patient is a 56 y.o. female for neurology referral She is having more problems in her legs. Has been going on for several months without improvement. Feels like it is related to back- has not had a epidural in 5 years. Not having back pain. Reports numbness on bottom right leg. Happens when she is sitting or standing. Happening multiple times during the day.  More so in the front of her leg but will shot up her leg and down to her foot.  Feels like there is something moving.  No loss of bowel or bladder  Has worked with Dr Jaynee Eagles in the past in regards to this.  Jaynee Eagles referred her to [facility] for injection and ordered MRI. Would like to go through neurology to get this done.    Review of Systems:  Review of Systems  Constitutional:  Negative for chills and fever.  Respiratory:  Negative for cough, sputum production and shortness of breath.   Gastrointestinal:  Negative for constipation and diarrhea.  Genitourinary:  Negative for dysuria, frequency and urgency.  Musculoskeletal:  Negative for back pain, falls, joint pain and myalgias.  Skin: Negative.   Neurological:  Positive for tingling  and sensory change. Negative for dizziness, focal weakness, weakness and headaches.   Past Medical History:  Diagnosis Date   Allergy    Anemia    Diverticulosis    GERD (gastroesophageal reflux disease)    High blood pressure    Leg pain    Numbness    Perimenopause    Tubular adenoma of colon    Past Surgical History:  Procedure Laterality Date   BREAST EXCISIONAL BIOPSY Left    30 years ago; benign   Cyst removed from Left Breast  [facility]     Uterine abalation   TUBAL LIGATION     WISDOM TOOTH EXTRACTION     Social History:   reports that she has never smoked. She has never used smokeless tobacco. She reports current alcohol use of about 6.0 standard drinks per week. She reports that she does not use drugs.  Family History  Problem Relation Age of Onset   Cancer Father        lung cancer   Hypertension Father    Hypertension Mother    Arthritis Mother    Cancer Maternal Grandmother    COPD Paternal Aunt    Alzheimer's disease Paternal Grandmother    Colon cancer Neg Hx    Esophageal cancer Neg Hx    Liver cancer Neg Hx    Pancreatic cancer Neg Hx    Rectal cancer Neg Hx    Stomach  cancer Neg Hx     Medications: Patient's Medications  New Prescriptions   No medications on file  Previous Medications   ACETAMINOPHEN (TYLENOL) 500 MG TABLET    Take 500 mg by mouth daily. Patient takes it if not taking Goodys powder   ASCORBIC ACID (VITAMIN C PO)    Take 1 tablet by mouth daily.   ASPIRIN-ACETAMINOPHEN-CAFFEINE (GOODYS EXTRA STRENGTH PO)    Take 1 packet by mouth as needed (For headache).   CETIRIZINE (ZYRTEC) 10 MG TABLET    Take 10 mg by mouth daily.   CHOLECALCIFEROL (VITAMIN D3) 2000 UNITS TABS    Take 2,000 Units by mouth daily.   FLUTICASONE (FLONASE) 50 MCG/ACT NASAL SPRAY    SHAKE LIQUID AND USE 1 SPRAY IN EACH NOSTRIL DAILY   IBUPROFEN (ADVIL) 800 MG TABLET    TAKE 1 TABLET(800 MG) BY MOUTH DAILY AS NEEDED FOR MILD PAIN OR  MODERATE PAIN   MONTELUKAST (SINGULAIR) 10 MG TABLET    Take 1 tablet (10 mg total) by mouth at bedtime.   OLMESARTAN-HYDROCHLOROTHIAZIDE (BENICAR HCT) 40-25 MG TABLET    Take 1 tablet by mouth daily.   TACROLIMUS (PROTOPIC) 0.1 % OINTMENT    Apply topically as needed.   UNABLE TO FIND    Med Name: Neuriva Plus 1 capsule daily for brain function   ZOLPIDEM (AMBIEN) 5 MG TABLET    Take 5 mg by mouth at bedtime as needed.  Modified Medications   No medications on file  Discontinued Medications   No medications on file    Physical Exam:  Vitals:   03/17/22 1026  BP: 120/83  Pulse: 91  Temp: (!) 97 F (36.1 C)  SpO2: 99%  Weight: 166 lb 1.6 oz (75.3 kg)  Height: '5\' 3"'$  (1.6 m)   Body mass index is 29.42 kg/m. Wt Readings from Last 3 Encounters:  03/17/22 166 lb 1.6 oz (75.3 kg)  12/28/21 162 lb (73.5 kg)  01/07/21 158 lb 12.8 oz (72 kg)    Physical Exam Constitutional:      General: She is not in acute distress.    Appearance: She is well-developed. She is not diaphoretic.  Cardiovascular:     Heart sounds: Normal heart sounds.  Musculoskeletal:        General: No deformity. Normal range of motion.     Right lower leg: No edema.     Left lower leg: No edema.  Skin:    General: Skin is warm and dry.  Neurological:     Mental Status: She is alert and oriented to person, place, and time.     Sensory: No sensory deficit.     Motor: No weakness.     Coordination: Coordination normal.     Gait: Gait normal.     Deep Tendon Reflexes: Reflexes normal.  Psychiatric:        Mood and Affect: Mood normal.    Labs reviewed: Basic Metabolic Panel: Recent Labs    12/28/21 1625  NA 139  K 4.3  CL 102  CO2 28  GLUCOSE 94  BUN 25  CREATININE 0.68  CALCIUM 10.8*   Liver Function Tests: Recent Labs    12/28/21 1625  AST 24  ALT 22  BILITOT 0.5  PROT 8.1   No results for input(s): LIPASE, AMYLASE in the last 8760 hours. No results for input(s): AMMONIA in the last  8760 hours. CBC: Recent Labs    12/28/21 1625  WBC 6.7  NEUTROABS 3,209  HGB 12.2  HCT 35.8  MCV 100.0  PLT 294   Lipid Panel: Recent Labs    12/28/21 1625  CHOL 235*  HDL 102  LDLCALC 116*  TRIG 73  CHOLHDL 2.3   TSH: No results for input(s): TSH in the last 8760 hours. A1C: Lab Results  Component Value Date   HGBA1C 5.3 06/19/2014     Assessment/Plan 1. Right lumbosacral radiculopathy -worsening radiculopathy -discussed PT but also wants neurology follow up- will let us know if she wants to do PT. - Ambulatory referral to Neurology  Guila Owensby K. [place], [facility] [phone]

## 2022-04-21 ENCOUNTER — Other Ambulatory Visit: Payer: Self-pay | Admitting: *Deleted

## 2022-04-21 MED ORDER — OLMESARTAN MEDOXOMIL-HCTZ 40-25 MG PO TABS: 1.0000 | ORAL_TABLET | Freq: Every day | ORAL | 3 refills | Status: DC

## 2022-04-21 NOTE — Telephone Encounter (Addendum)
Message left on clinical intake voicemail:  Patient called back to inform Davinia that rx should go to [facility] and not [facility] .  I called [facility] @ [phone] , spoke with a representative who advised that we would need to call back to cancel rx as it was too soon to cancel. Representative was unable to make a note.  We will need to call [facility] back in about 1-2 hours.    RX was sent to [facility] as requested.

## 2022-04-21 NOTE — Telephone Encounter (Signed)
Patient called and stated that she spoke with someone several weeks ago regarding sending her BP medication Refill to the [facility], [facility], but they have never received. It.   Reviewed chart and the Rx was faxed to [facility].   Faxed to [facility] per patient's request.

## 2022-04-21 NOTE — Addendum Note (Signed)
Addended by: Logan Bores on: 04/21/2022 01:34 PM   Modules accepted: Orders

## 2022-05-12 ENCOUNTER — Ambulatory Visit (INDEPENDENT_AMBULATORY_CARE_PROVIDER_SITE_OTHER): Payer: 59 | Admitting: Neurology

## 2022-05-12 ENCOUNTER — Encounter: Payer: Self-pay | Admitting: Neurology

## 2022-05-12 VITALS — BP 121/82 | HR 84 | Ht 63.0 in | Wt 164.1 lb

## 2022-05-12 DIAGNOSIS — R198 Other specified symptoms and signs involving the digestive system and abdomen: Secondary | ICD-10-CM

## 2022-05-12 DIAGNOSIS — R269 Unspecified abnormalities of gait and mobility: Secondary | ICD-10-CM

## 2022-05-12 DIAGNOSIS — R202 Paresthesia of skin: Secondary | ICD-10-CM

## 2022-05-12 DIAGNOSIS — M5416 Radiculopathy, lumbar region: Secondary | ICD-10-CM | POA: Diagnosis not present

## 2022-05-12 DIAGNOSIS — R3989 Other symptoms and signs involving the genitourinary system: Secondary | ICD-10-CM

## 2022-05-12 DIAGNOSIS — R200 Anesthesia of skin: Secondary | ICD-10-CM

## 2022-05-12 MED ORDER — METHYLPREDNISOLONE 4 MG PO TBPK: ORAL_TABLET | ORAL | 1 refills | Status: DC

## 2022-05-12 MED ORDER — GABAPENTIN 300 MG PO CAPS: 300.0000 mg | ORAL_CAPSULE | Freq: Three times a day (TID) | ORAL | 11 refills | Status: DC

## 2022-05-12 NOTE — Patient Instructions (Addendum)
MRI lumbar spine Order ESI  medrol dosepak and gabapentin If needed call for tramadol or few days of oxy   Methylprednisolone Tablets What is this medication? METHYLPREDNISOLONE (meth ill pred NISS [place]) treats many conditions such as asthma, allergic reactions, arthritis, inflammatory bowel diseases, adrenal, and blood or bone marrow disorders. It works by decreasing inflammation, slowing down an overactive immune system, or replacing cortisol normally made in the body. Cortisol is a hormone that plays an important role in how the body responds to stress, illness, and injury. It belongs to a group of medications called steroids. This medicine may be used for other purposes; ask your health care provider or pharmacist if you have questions. COMMON BRAND NAME(S): Medrol, Medrol Dosepak What should I tell my care team before I take this medication? They need to know if you have any of these conditions: Cushing's syndrome Eye disease, vision problems Diabetes Glaucoma Heart disease High blood pressure Infection (especially a virus infection such as chickenpox, cold sores, or herpes) Liver disease Mental illness Myasthenia gravis Osteoporosis Recently received or scheduled to receive a vaccine Seizures Stomach or intestine problems Thyroid disease An unusual or allergic reaction to lactose, methylprednisolone, other medications, foods, dyes, or preservatives Pregnant or trying to get pregnant Breast-feeding How should I use this medication? Take this medication by mouth with a glass of water. Follow the directions on the prescription label. Take this medication with food. If you are taking this medication once a day, take it in the morning. Do not take it more often than directed. Do not suddenly stop taking your medication because you may develop a severe reaction. Your care team will tell you how much medication to take. If your care team wants you to stop the medication, the dose  may be slowly lowered over time to avoid any side effects. Talk to your care team about the use of this medication in children. Special care may be needed. Overdosage: If you think you have taken too much of this medicine contact a poison control center or emergency room at once. NOTE: This medicine is only for you. Do not share this medicine with others. What if I miss a dose? If you miss a dose, take it as soon as you can. If it is almost time for your next dose, talk to your care team. You may need to miss a dose or take an extra dose. Do not take double or extra doses without advice. What may interact with this medication? Do not take this medication with any of the following: Alefacept Echinacea Live virus vaccines Metyrapone Mifepristone This medication may also interact with the following: Amphotericin B Aspirin and aspirin-like medications Certain antibiotics like erythromycin, clarithromycin, troleandomycin Certain medications for diabetes Certain medications for fungal infections like ketoconazole Certain medications for seizures like carbamazepine, phenobarbital, phenytoin Certain medications that treat or prevent blood clots like warfarin Cholestyramine Cyclosporine Digoxin Diuretics Female hormones, like estrogens and birth control pills Isoniazid NSAIDs, medications for pain inflammation, like ibuprofen or naproxen Other medications for myasthenia gravis Rifampin Vaccines This list may not describe all possible interactions. Give your health care provider a list of all the medicines, herbs, non-prescription drugs, or dietary supplements you use. Also tell them if you smoke, drink alcohol, or use illegal drugs. Some items may interact with your medicine. What should I watch for while using this medication? Tell your care team if your symptoms do not start to get better or if they get worse. Do not stop  taking except on your care team's advice. You may develop a severe  reaction. Your care team will tell you how much medication to take. This medication may increase your risk of getting an infection. Tell your care team if you are around anyone with measles or chickenpox, or if you develop sores or blisters that do not heal properly. This medication may increase blood sugar levels. Ask your care team if changes in diet or medications are needed if you have diabetes. Tell your care team right away if you have any change in your eyesight. Using this medication for a long time may increase your risk of low bone mass. Talk to your care team about bone health. What side effects may I notice from receiving this medication? Side effects that you should report to your care team as soon as possible: Allergic reactions--skin rash, itching, hives, swelling of the face, lips, tongue, or throat Cushing syndrome--increased fat around the midsection, upper back, neck, or face, pink or purple stretch marks on the skin, thinning, fragile skin that easily bruises, unexpected hair growth High blood sugar (hyperglycemia)--increased thirst or amount of urine, unusual weakness or fatigue, blurry vision Increase in blood pressure Infection--fever, chills, cough, sore throat, wounds that don't heal, pain or trouble when passing urine, general feeling of discomfort or being unwell Low adrenal gland function--nausea, vomiting, loss of appetite, unusual weakness or fatigue, dizziness Mood and behavior changes--anxiety, nervousness, confusion, hallucinations, irritability, hostility, thoughts of suicide or self-harm, worsening mood, feelings of depression Stomach bleeding--bloody or black, tar-like stools, vomiting blood or brown material that looks like coffee grounds Swelling of the ankles, hands, or feet Side effects that usually do not require medical attention (report to your care team if they continue or are bothersome): Acne General discomfort and fatigue Headache Increase in  appetite Nausea Trouble sleeping Weight gain This list may not describe all possible side effects. Call your doctor for medical advice about side effects. You may report side effects to FDA at 1-800-FDA-1088. Where should I keep my medication? Keep out of the reach of children and pets. Store at room temperature between 20 and 25 degrees C (68 and 77 degrees F). Throw away any unused medication after the expiration date. NOTE: This sheet is a summary. It may not cover all possible information. If you have questions about this medicine, talk to your doctor, pharmacist, or health care provider.  2023 Elsevier/Gold Standard (2021-01-06 00:00:00) Gabapentin Capsules or Tablets What is this medication? GABAPENTIN (GA ba pen tin) treats nerve pain. It may also be used to prevent and control seizures in people with epilepsy. It works by calming overactive nerves in your body. This medicine may be used for other purposes; ask your health care provider or pharmacist if you have questions. COMMON BRAND NAME(S): Active-PAC with Gabapentin, Orpha Bur, Gralise, Neurontin What should I tell my care team before I take this medication? They need to know if you have any of these conditions: Alcohol or substance use disorder Kidney disease Lung or breathing disease Suicidal thoughts, plans, or attempt; a previous suicide attempt by you or a family member An unusual or allergic reaction to gabapentin, other medications, foods, dyes, or preservatives Pregnant or trying to get pregnant Breast-feeding How should I use this medication? Take this medication by mouth with a glass of water. Follow the directions on the prescription label. You can take it with or without food. If it upsets your stomach, take it with food. Take your medication at regular intervals. Do  not take it more often than directed. Do not stop taking except on your care team's advice. If you are directed to break the 600 or 800 mg tablets in  half as part of your dose, the extra half tablet should be used for the next dose. If you have not used the extra half tablet within 28 days, it should be thrown away. A special MedGuide will be given to you by the pharmacist with each prescription and refill. Be sure to read this information carefully each time. Talk to your care team about the use of this medication in children. While this medication may be prescribed for children as young as 3 years for selected conditions, precautions do apply. Overdosage: If you think you have taken too much of this medicine contact a poison control center or emergency room at once. NOTE: This medicine is only for you. Do not share this medicine with others. What if I miss a dose? If you miss a dose, take it as soon as you can. If it is almost time for your next dose, take only that dose. Do not take double or extra doses. What may interact with this medication? Alcohol Antihistamines for allergy, cough, and cold Certain medications for anxiety or sleep Certain medications for depression like amitriptyline, fluoxetine, sertraline Certain medications for seizures like phenobarbital, primidone Certain medications for stomach problems General anesthetics like halothane, isoflurane, methoxyflurane, propofol Local anesthetics like lidocaine, pramoxine, tetracaine Medications that relax muscles for surgery Opioid medications for pain Phenothiazines like chlorpromazine, mesoridazine, prochlorperazine, thioridazine This list may not describe all possible interactions. Give your health care provider a list of all the medicines, herbs, non-prescription drugs, or dietary supplements you use. Also tell them if you smoke, drink alcohol, or use illegal drugs. Some items may interact with your medicine. What should I watch for while using this medication? Visit your care team for regular checks on your progress. You may want to keep a record at home of how you feel your  condition is responding to treatment. You may want to share this information with your care team at each visit. You should contact your care team if your seizures get worse or if you have any new types of seizures. Do not stop taking this medication or any of your seizure medications unless instructed by your care team. Stopping your medication suddenly can increase your seizures or their severity. This medication may cause serious skin reactions. They can happen weeks to months after starting the medication. Contact your care team right away if you notice fevers or flu-like symptoms with a rash. The rash may be red or purple and then turn into blisters or peeling of the skin. Or, you might notice a red rash with swelling of the face, lips or lymph nodes in your neck or under your arms. Wear a medical identification bracelet or chain if you are taking this medication for seizures. Carry a card that lists all your medications. This medication may affect your coordination, reaction time, or judgment. Do not drive or operate machinery until you know how this medication affects you. Sit up or stand slowly to reduce the risk of dizzy or fainting spells. Drinking alcohol with this medication can increase the risk of these side effects. Your mouth may get dry. Chewing sugarless gum or sucking hard candy, and drinking plenty of water may help. Watch for new or worsening thoughts of suicide or depression. This includes sudden changes in mood, behaviors, or thoughts. These changes can  happen at any time but are more common in the beginning of treatment or after a change in dose. Call your care team right away if you experience these thoughts or worsening depression. If you become pregnant while using this medication, you may enroll in the [place] Pregnancy Registry by calling [phone]. This registry collects information about the safety of antiepileptic medication use during  pregnancy. What side effects may I notice from receiving this medication? Side effects that you should report to your care team as soon as possible: Allergic reactions or angioedema--skin rash, itching, hives, swelling of the face, eyes, lips, tongue, arms, or legs, trouble swallowing or breathing Rash, fever, and swollen lymph nodes Thoughts of suicide or self harm, worsening mood, feelings of depression Trouble breathing Unusual changes in mood or behavior in children after use such as difficulty concentrating, hostility, or restlessness Side effects that usually do not require medical attention (report to your care team if they continue or are bothersome): Dizziness Drowsiness Nausea Swelling of ankles, feet, or hands Vomiting This list may not describe all possible side effects. Call your doctor for medical advice about side effects. You may report side effects to FDA at 1-800-FDA-1088. Where should I keep my medication? Keep out of reach of children and pets. Store at room temperature between 15 and 30 degrees C (59 and 86 degrees F). Get rid of any unused medication after the expiration date. This medication may cause accidental overdose and death if taken by other adults, children, or pets. To get rid of medications that are no longer needed or have expired: Take the medication to a medication take-back program. Check with your pharmacy or law enforcement to find a location. If you cannot return the medication, check the label or package insert to see if the medication should be thrown out in the garbage or flushed down the toilet. If you are not sure, ask your care team. If it is safe to put it in the trash, empty the medication out of the container. Mix the medication with cat litter, dirt, coffee grounds, or other unwanted substance. Seal the mixture in a bag or container. Put it in the trash. NOTE: This sheet is a summary. It may not cover all possible information. If you have  questions about this medicine, talk to your doctor, pharmacist, or health care provider.  2023 Elsevier/Gold Standard (2021-04-13 00:00:00)

## 2022-05-12 NOTE — Progress Notes (Signed)
[facility]    Provider:  Dr Jaynee Eagles Referring Provider: Lauree Chandler, NP Primary Care Physician:  Lauree Chandler, NP  CC:  Radiculopathy  Addendum 05/20/2022: MRI lumbar spine: T12-L1: no spinal stenosis or foraminal narrowing L1-2:  no spinal stenosis or foraminal narrowing L2-3: disc bulging with no spinal stenosis or foraminal narrowing  L3-4: disc bulging and facet hypertrophy with mild spinal stenosis and mild biforaminal stenosis  L4-5: pseudo disc bulging and facet hypertrophy with moderate spinal stenosis and moderate bilateral foraminal stenosis L5-S1: disc bulging and facet hypertrophy with mild biforaminal stenosis    Her MRI shows multilevel degenerative changes and moderate spinal stenosis. I believe her distribution corresponds to right L4. We can send her for injections right L4 if she likes (she requests Dr. Jobe Igo perform it again, she had it with him in the past and said he was GREAT). Her pain is in the right L4 distribution I believe and she does have  bulging and facet hypertrophy with spinal stenosis and biforaminal stenosis in those arease. We can place order for right L4 ESI to dr mattern(Dr. Jobe Igo can review chart and make decision on what type of injection and where, he can eval and treat, he is best to make that decision) and in addition given the moderate stenosis we can send to [place] [facility] for evaluation(or hold off until after the injections) thanks   05/12/2022: Lovely patient we have seen in the past, reviewed her records,  In the past had GREAT response to left sided sciatica with ESI. The pain went away, did great but now has new symptoms right leg, Lower back pain again, but the pain is numbness, anterior lower leg, will also radiate to the anterolateral thigh and top of foot. Ongoing for the last 6 months, she has been under the care of her primary care physician for at least 6 months, she has tried conservative   measures including over-the-counter analgesics, prescription medications,, muscle relaxers muscle relaxers, stretching physical therapy, pain medications, heat and cold.  Having a difficulty with her gait, her right leg goes numb, it is more in the anterior lateral upper thigh radiating down the lower leg to the foot, associated low back pain, can be initiated by different positions or walking too much, can be excruciatingly painful affecting her life, patient has to sit down or lay down and cannot move, has fallen and hurt herself because of this, might be having some changes in bowel and bladder, she has known history of severe degenerative changes and scoliosis in the low back.  01/14/2022: cbc/cmp unremarkable, reviewed data  Reviewed images and agree with below   STUDY DATE: 06/27/2014  PATIENT NAME: Anita Brandt  DOB: 04-08-1966  MRN: [id number]   ORDERING CLINICIAN: Dr Jaynee Eagles  CLINICAL HISTORY: 25 year patient with paresthesias  COMPARISON FILMS:  none  EXAM: MRI Lumbar Spine w/wo  TECHNIQUE: MRI of the lumbar spine was obtained utilizing 4 mm sagittal  slices from W09-81 down to the lower sacrum with T1, T2 and inversion  recovery views. In addition 4 mm axial slices from X9-1 down to L5-S1  level were included with T1 and T2 weighted views.Post contrast sagittal  and axial T 1 images were obtained as well.  CONTRAST: 15 ml IV magnevist  IMAGING SITE:[facility]   FINDINGS:  The lumbar vertebrae demonstrate scoliosis to the left but normal body  height and marrow signal characteristics. The intervertebral discs show  mild disc signal  abnormalities throughout. L1-L2 shows prominent  asymmetric facet hypertrophy but no significant stenosis. L2-3 shows  slight lateral disc osteophyte protrusion to the left resulting in mild  foraminal narrowing and asymmetric facet hypertrophy posteriorly but no  significant compression. L3-4 shows similar changes but more prominent  left  more than right foraminal narrowing. L4-5 shows left lateral disc  osteophyte bulge resulting in moderate foraminal narrowing as well as  facet hypertrophy .Marland Kitchen L5-S1 shows mild facet hypertrophy with and foraminal  narrowing on the right greater than left. The conus medullaris terminates  at L1. The visualized thoracic spine soft tissues appear unremarkable.  Postcontrast images do not result in abnormal areas of enhancement.       Impression   Abnormal MRI scan lumbar spine showing marked scoliosis and  spondylitic changes throughout most prominent at L 3-4 and L4-5 where  there is left greater than right foraminal narrowing.      02/23/2018 Interval history: She continues to have pain in her left buttocks with radiation down the left leg. Symptoms started years agp and radiates down to the foot. MRI in the past showed  marked scoliosis and spondylitic changes throughout most prominent at L 3-4 and L4-5 where there is left greater than right foraminal narrowing. Sitting for long periods and laying for long periods make it worse. She has conservative treatment for over a year, she went on a diet and lost weight, tried neurontin and OTC analgesics, she went to physical therapy, EMG/NCS showed acute/ongoing denervation in the left paraspinals c/w ongoing nerve pinching. She wants to do the shots will send to [facility] and if doesn't make a difference will refer to [place] [facility] and reorder MRI lumbar. She did extremely well with epidural injection on the left at L5-S1 with Dr. Maree Erie. She still worries about memory loss, reassured patient and empathized with her FHx od dementia, recommend the MIND diet out of rush.  HPI:  Anita Brandt is a 56 y.o. female here as a new referral from Dr. Dewaine Oats for a new problem memory loss. Past medical history of hypertension, under a lot of stress. She was seen several years ago for left-sided paresthesias and MRi brain was normal, had degenerative changes in  the neck and lumbar spine and had epidural injections the symptoms have resolved. She felt better when she was on a diet and lost 10 pounds. Paternal grandmother had dementia. She is under a lot of stress with her husband's cancer. She is menopausal. Her memory isn't "there". She really started noticing it in December after her husband finished radiation therapy and was trying to regain normalcy. This past weekend she couldn't remember the name of the comedian they saw that Friday in TV but she did fall asleep. She takes Ambien at night to sleep. Husband has a feeding tube and a feeding pump and she lods it and so does not sleep well. Possibly getting slightly worse. She couldn't remember the 3 words on the MMSE. She has to write everything down, if it doesn't make it on the list it will not get done. She is back to work recently as well just came off of FMLA. She does not get lost driving. She does forget names but not of people who are close to her, she hadn't seen a friend for a year and forgot her name in the grocery store, bills are paid, she cares for her husband. 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 No other focal neurologic  deficits, associated symptoms, inciting events or modifiable factors.   Reviewed notes, labs and imaging from outside physicians, which showed: Reviewed primary care notes. Patient is concerned about short-term memory loss and forgetfulness.She is menopausal without an elevated [place] 2016. Her grandmother has Alzheimer's. She has become much more forgetful lately.She has also been under a lot of stress. Her husband had tonsillar cancer. He has continuing issues with infusion feeding tube to deal with. She has a 49 year old daughter. She has insomnia and uses Ambien at night. Reviewed physical exam which was normal including blood pressure, head, ears, eyes, nose, throat, lungs, heart, abdomen.  Hemoglobin 10.7 mildly anemic in the past but last CBC was normal with hemoglobin 13,  TSH 0.94, vitamin D 34  She was seen in the past in August 2015 for  left-sided paresthesias and MRI of the brain, lumbar and cervical spine and EMG nerve conduction studies were ordered. MRI of the brain was normal. Cervical and lumbar showed degenrative disease but normal cord and cauda equina.   personally reviewed images mri brain 06/2014 which was normal  Review of Systems: Patient complains of symptoms per HPI as well as the following symptoms: Memory loss, no shortness of breath, no chest pain. Pertinent negatives per HPI. All others negative.   Social History   Socioeconomic History   Marital status: Married    Spouse name: Not on file   Number of children: 1   Years of education: BS   Highest education level: Not on file  Occupational History   Occupation: Theme park manager: Counsellor    Comment: full-time  Tobacco Use   Smoking status: Never   Smokeless tobacco: Never  Vaping Use   Vaping Use: Never used  Substance and Sexual Activity   Alcohol use: Yes    Alcohol/week: 6.0 standard drinks of alcohol    Types: 6 Standard drinks or equivalent per week    Comment: less than 5 times per week ( beer, wine and hard liquor)   Drug use: No   Sexual activity: Not Currently  Other Topics Concern   Not on file  Social History Narrative   Patient is married with one child.   Patient is right handed.   Patient has a BS degree.   Patient drinks 1 8 oz caffeine drink daily at the most.            Diet: depends daily   Do you drink/eat things with caffeine? Yes   Marital status: Married                             What year were you married? 2012   Do you live in a house, apartment, assisted living, condo, trailer, etc)?  House   Is it one or more stories? 2 stories   How many persons live in your home? 3   Do you have any pets in your home? No pets   Current or past profession: Psychologist, prison and probation services   Do you exercise?     sometimes                                                 Type & how often: Irregular   Do you have a living will? No   Do you have a DNR  Form? No   Do you have a POA/HPOA forms? No   Social Determinants of Radio broadcast assistant Strain: Not on file  Food Insecurity: Not on file  Transportation Needs: Not on file  Physical Activity: Not on file  Stress: Not on file  Social Connections: Not on file  Intimate Partner Violence: Not on file    Family History  Problem Relation Age of Onset   Cancer Father        lung cancer   Hypertension Father    Hypertension Mother    Arthritis Mother    Cancer Maternal Grandmother    COPD Paternal Aunt    Alzheimer's disease Paternal Grandmother    Colon cancer Neg Hx    Esophageal cancer Neg Hx    Liver cancer Neg Hx    Pancreatic cancer Neg Hx    Rectal cancer Neg Hx    Stomach cancer Neg Hx     Past Medical History:  Diagnosis Date   Allergy    Anemia    Diverticulosis    GERD (gastroesophageal reflux disease)    High blood pressure    Leg pain    Numbness    Perimenopause    Tubular adenoma of colon     Past Surgical History:  Procedure Laterality Date   BREAST EXCISIONAL BIOPSY Left    30 years ago; benign   Cyst removed from Left Breast  1984   DILATION AND CURETTAGE OF UTERUS     Uterine abalation   TUBAL LIGATION     WISDOM TOOTH EXTRACTION      Current Outpatient Medications  Medication Sig Dispense Refill   acetaminophen (TYLENOL) 500 MG tablet Take 500 mg by mouth daily. Patient takes it if not taking Goodys powder     Ascorbic Acid (VITAMIN C PO) Take 1 tablet by mouth daily.     Aspirin-Acetaminophen-Caffeine (GOODYS EXTRA STRENGTH PO) Take 1 packet by mouth as needed (For headache).     cetirizine (ZYRTEC) 10 MG tablet Take 10 mg by mouth daily.     Cholecalciferol (VITAMIN D3) 2000 units TABS Take 2,000 Units by mouth daily.     fluticasone (FLONASE) 50 MCG/ACT nasal spray SHAKE LIQUID AND USE 1 SPRAY IN EACH NOSTRIL DAILY 16 g 3   gabapentin  (NEURONTIN) 300 MG capsule Take 1-2 capsules (300-600 mg total) by mouth 3 (three) times daily. Can take up to 4 pills at bedtime prn 90 capsule 11   ibuprofen (ADVIL) 800 MG tablet TAKE 1 TABLET(800 MG) BY MOUTH DAILY AS NEEDED FOR MILD PAIN OR MODERATE PAIN 30 tablet 1   methylPREDNISolone (MEDROL DOSEPAK) 4 MG TBPK tablet Take pills daily all together with food. Take the first dose (6 pills) as soon as possible. Take the rest each morning. For 6 days total 6-5-4-3-2-1. 21 tablet 1   montelukast (SINGULAIR) 10 MG tablet Take 1 tablet (10 mg total) by mouth at bedtime. 90 tablet 1   olmesartan-hydrochlorothiazide (BENICAR HCT) 40-25 MG tablet Take 1 tablet by mouth daily. 90 tablet 3   tacrolimus (PROTOPIC) 0.1 % ointment Apply topically as needed.     UNABLE TO FIND Med Name: Neuriva Plus 1 capsule daily for brain function     zolpidem (AMBIEN) 5 MG tablet Take 5 mg by mouth at bedtime as needed.     No current facility-administered medications for this visit.    Allergies as of 05/12/2022 - Review Complete 05/12/2022  Allergen Reaction Noted   Penicillin  g potassium in d5w  06/18/2014   Sulfa antibiotics  06/18/2014    Vitals: BP 121/82   Pulse 84   Ht '5\' 3"'$  (1.6 m)   Wt 164 lb 2 oz (74.4 kg)   LMP  (LMP Unknown)   BMI 29.07 kg/m  Last Weight:  Wt Readings from Last 1 Encounters:  05/12/22 164 lb 2 oz (74.4 kg)   Last Height:   Ht Readings from Last 1 Encounters:  05/12/22 '5\' 3"'$  (1.6 m)    Physical exam: Exam: Gen: NAD, conversant, well nourised, obese, well groomed                     CV: RRR, no MRG. No Carotid Bruits. No peripheral edema, warm, nontender Eyes: Conjunctivae clear without exudates or hemorrhage  Neuro: Detailed Neurologic Exam  Speech:    Speech is normal; fluent and spontaneous with normal comprehension.  Cognition:    The patient is oriented to person, place, and time;     recent and remote memory intact;     language fluent;     normal  attention, concentration,     fund of knowledge Cranial Nerves:    The pupils are equal, round, and reactive to light. The fundi are normal and spontaneous venous pulsations are present. Visual fields are full to finger confrontation. Extraocular movements are intact. Trigeminal sensation is intact and the muscles of mastication are normal. The face is symmetric. The palate elevates in the midline. Hearing intact. Voice is normal. Shoulder shrug is normal. The tongue has normal motion without fasciculations.   Coordination:    Normal finger to nose and heel to shin. Normal rapid alternating movements.   Gait:    Heel-toe and tandem gait are normal.   Motor Observation:    No asymmetry, no atrophy, and no involuntary movements noted. Tone:    Normal muscle tone.    Posture:    Posture is normal. normal erect    Strength: Right hip flexion 4/5 left leg flexion 4+/5 otherwise strength is V/V in the upper and lower limbs.      Sensation: intact to LT     Reflex Exam:  DTR's:    Deep tendon reflexes in the upper and lower extremities are normal bilaterally.   Toes:    The toes are downgoing bilaterally.   Clonus:    Clonus is absent.       Assessment/Plan:   56 y.o. female with likely right l4 radiculopathy. She did extremely well with epidural injection on the left at L5-S1 with Dr. Maree Erie in the past. Prior EMG/NCS confimred left acute/ongoing radiculopathy. She has not had repeat MRI imaging for many ears, will repeat and then send back for ESI; Lower back pain again, radiation from antero-lateral thigh to right lower leg,  and top of foot. Ongoing for the last 6 months, she has been under the care of her primary care physician for at least 6 months, she has tried conservative  measures including over-the-counter analgesics, prescription medications, muscle relaxers, stretching physical therapy, pain medications, heat and cold therapy.  Having a difficulty with her gait, her right  leg goes numb, associated low back pain, can be initiated by different positions or walking too much, can be excruciatingly painful affecting her life, patient has to sit down or lay down and cannot move, has fallen and hurt herself because of this, might be having some changes in bowel and bladder, she has known history of severe  degenerative changes and scoliosis in the low back.  Addendum 05/20/2022: MRI lumbar spine: T12-L1: no spinal stenosis or foraminal narrowing L1-2:  no spinal stenosis or foraminal narrowing L2-3: disc bulging with no spinal stenosis or foraminal narrowing  L3-4: disc bulging and facet hypertrophy with mild spinal stenosis and mild biforaminal stenosis  L4-5: pseudo disc bulging and facet hypertrophy with moderate spinal stenosis and moderate bilateral foraminal stenosis L5-S1: disc bulging and facet hypertrophy with mild biforaminal stenosis    Her MRI shows multilevel degenerative changes and moderate spinal stenosis. I believe her distribution corresponds to right L4. We can send her for injections right L4 if she likes (she requests Dr. Jobe Igo perform it again, she had it with him in the past and said he was GREAT). Her pain is in the right L4 distribution I believe and she does have  bulging and facet hypertrophy with spinal stenosis and biforaminal stenosis in those arease. We can place order for right L4 ESI to dr mattern(Dr. Jobe Igo can review chart and make decision on what type of injection and where, he can eval and treat, he is best to make that decision) and in addition given the moderate stenosis we can send to [place] [facility] for evaluation(or hold off until after the injections) thanks    - order ESI with Dr. Jobe Igo, need repeat MRI lumbar spine medrol dosepak and gabapentin If needed call for tramadol or few days of oxy - Neurontin and medrol dosepak for pain  Orders Placed This Encounter  Procedures   MR LUMBAR SPINE WO CONTRAST   DG  Epidural/Nerve Root   Meds ordered this encounter  Medications   methylPREDNISolone (MEDROL DOSEPAK) 4 MG TBPK tablet    Sig: Take pills daily all together with food. Take the first dose (6 pills) as soon as possible. Take the rest each morning. For 6 days total 6-5-4-3-2-1.    Dispense:  21 tablet    Refill:  1   gabapentin (NEURONTIN) 300 MG capsule    Sig: Take 1-2 capsules (300-600 mg total) by mouth 3 (three) times daily. Can take up to 4 pills at bedtime prn    Dispense:  90 capsule    Refill:  [place], MD  [facility] [address] [place], [place] [address]  Phone [phone] Fax [phone]

## 2022-05-16 ENCOUNTER — Encounter: Payer: Self-pay | Admitting: Neurology

## 2022-05-16 DIAGNOSIS — M5416 Radiculopathy, lumbar region: Secondary | ICD-10-CM | POA: Insufficient documentation

## 2022-05-17 ENCOUNTER — Telehealth: Payer: Self-pay | Admitting: Neurology

## 2022-05-17 NOTE — Telephone Encounter (Signed)
[employer] [facility] obtains [facility] NPR case #[id number] sent to GI

## 2022-05-18 ENCOUNTER — Ambulatory Visit
Admission: RE | Admit: 2022-05-18 | Discharge: 2022-05-18 | Disposition: A | Discharge status: Home / Self Care | Payer: 59 | Source: Ambulatory Visit | Attending: Neurology | Admitting: Neurology

## 2022-05-18 DIAGNOSIS — M5416 Radiculopathy, lumbar region: Secondary | ICD-10-CM | POA: Diagnosis not present

## 2022-05-18 DIAGNOSIS — R200 Anesthesia of skin: Secondary | ICD-10-CM

## 2022-05-18 DIAGNOSIS — R3989 Other symptoms and signs involving the genitourinary system: Secondary | ICD-10-CM

## 2022-05-18 DIAGNOSIS — R269 Unspecified abnormalities of gait and mobility: Secondary | ICD-10-CM

## 2022-05-20 NOTE — Addendum Note (Signed)
Addended by: Sarina Ill B on: 05/20/2022 10:16 AM   Modules accepted: Orders

## 2022-05-24 ENCOUNTER — Telehealth: Payer: Self-pay | Admitting: *Deleted

## 2022-05-24 DIAGNOSIS — M5136 Other intervertebral disc degeneration, lumbar region: Secondary | ICD-10-CM

## 2022-05-24 DIAGNOSIS — M5416 Radiculopathy, lumbar region: Secondary | ICD-10-CM

## 2022-05-24 DIAGNOSIS — M47816 Spondylosis without myelopathy or radiculopathy, lumbar region: Secondary | ICD-10-CM

## 2022-05-24 NOTE — Addendum Note (Signed)
Addended by: Gildardo Griffes on: 05/24/2022 04:32 PM   Modules accepted: Orders

## 2022-05-24 NOTE — Telephone Encounter (Signed)
Spoke with patient and discussed her MRI lumbar spine results as noted below by Dr Jaynee Eagles. Pt would like to hold off on the neurosurgery referral until after the injections. She would like to proceed with the right L4 ESI by Dr Jobe Igo. Pt verbalized appreciation for the call. She will await a call from GI to schedule.

## 2022-05-24 NOTE — Telephone Encounter (Signed)
-----   Message from Melvenia Beam, MD sent at 05/20/2022 10:11 AM EDT ----- Her MRI shows multilevel degenerative changes and moderate spinal stenosis. We can send her for injections right L4 if she likes (she requests Dr. Jobe Igo perform it again). Her pain is in the right L4 distribution and she does have L3/L4 disc bulging and facet hypertrophy with mild spinal stenosis and mild biforaminal stenosis. We can place order for right L4 ESI to dr Jobe Igo and in addition given the moderate stenosis we can send to [place] [facility] for evaluation(or hold off until after the injections) thanks

## 2022-06-16 ENCOUNTER — Ambulatory Visit
Admission: RE | Admit: 2022-06-16 | Discharge: 2022-06-16 | Disposition: A | Discharge status: Home / Self Care | Payer: 59 | Source: Ambulatory Visit | Attending: Neurology | Admitting: Neurology

## 2022-06-16 DIAGNOSIS — M5136 Other intervertebral disc degeneration, lumbar region: Secondary | ICD-10-CM

## 2022-06-16 DIAGNOSIS — M5416 Radiculopathy, lumbar region: Secondary | ICD-10-CM

## 2022-06-16 DIAGNOSIS — M47816 Spondylosis without myelopathy or radiculopathy, lumbar region: Secondary | ICD-10-CM

## 2022-06-16 MED ORDER — METHYLPREDNISOLONE ACETATE 40 MG/ML INJ SUSP (RADIOLOG
80.0000 mg | Freq: Once | INTRAMUSCULAR | Status: AC
  Administered 2022-06-16: 80 mg via EPIDURAL

## 2022-06-16 MED ORDER — IOPAMIDOL (ISOVUE-M 200) INJECTION 41%
1.0000 mL | Freq: Once | INTRAMUSCULAR | Status: AC
  Administered 2022-06-16: 1 mL via EPIDURAL

## 2022-06-16 NOTE — Discharge Instructions (Signed)

## 2022-07-02 ENCOUNTER — Ambulatory Visit: Payer: 59 | Admitting: Nurse Practitioner

## 2022-07-19 ENCOUNTER — Encounter: Payer: Self-pay | Admitting: Nurse Practitioner

## 2022-07-19 ENCOUNTER — Ambulatory Visit: Payer: 59 | Admitting: Nurse Practitioner

## 2022-07-19 VITALS — BP 132/90 | HR 85 | Temp 97.1°F | Ht 63.0 in | Wt 166.6 lb

## 2022-07-19 DIAGNOSIS — E785 Hyperlipidemia, unspecified: Secondary | ICD-10-CM

## 2022-07-19 DIAGNOSIS — I10 Essential (primary) hypertension: Secondary | ICD-10-CM | POA: Diagnosis not present

## 2022-07-19 DIAGNOSIS — J301 Allergic rhinitis due to pollen: Secondary | ICD-10-CM

## 2022-07-19 DIAGNOSIS — M5417 Radiculopathy, lumbosacral region: Secondary | ICD-10-CM | POA: Diagnosis not present

## 2022-07-19 DIAGNOSIS — E663 Overweight: Secondary | ICD-10-CM

## 2022-07-19 DIAGNOSIS — F5101 Primary insomnia: Secondary | ICD-10-CM

## 2022-07-19 MED ORDER — MONTELUKAST SODIUM 10 MG PO TABS: 10.0000 mg | ORAL_TABLET | Freq: Every day | ORAL | 3 refills | Status: DC

## 2022-07-19 NOTE — Patient Instructions (Signed)
Please sign a record release for GYN (DR TOMBLIN)  on check out.

## 2022-07-19 NOTE — Progress Notes (Signed)
Careteam: Patient Care Team: Anita Chandler, NP as PCP - General (Geriatric Medicine) Anita Napoleon, PA-C (Inactive) (Dermatology) Loney Loh, MD (Dermatology) Everlene Farrier, MD as Consulting Physician (Obstetrics and Gynecology)  PLACE OF SERVICE:  [place] Directive information Does Patient Have a Medical Advance Directive?: No, Would patient like information on creating a medical advance directive?: Yes (MAU/Ambulatory/Procedural Areas - Information given)  Allergies  Allergen Reactions   Penicillin G Potassium In D5w     hives   Sulfa Antibiotics     hives    Chief Complaint  Patient presents with   Medical Management of Chronic Issues    6 month follow-up. Discuss need for td/tdap, shingrix, covid boosters, and mammogram. Refused Flu vaccine today. [facility] verified.      HPI: Patient is a 56 y.o. female for routine follow up  She has had her mammogram through her GYN. June 27th   Went to neurologist and got spinal epidural - leg is doing better. Has gabapentin to use PRN.   Seasonal allergies- using singulair, does not need flonase as often.   Htn- on benicar-hctz, avoids salt. Did eat bacon this morning.   No consistent with exercising now.   Sleep has been doing well.  She has weaned her self off ambien     Review of Systems:  Review of Systems  Constitutional:  Negative for chills, fever and weight loss.  HENT:  Negative for tinnitus.   Respiratory:  Negative for cough, sputum production and shortness of breath.   Cardiovascular:  Negative for chest pain, palpitations and leg swelling.  Gastrointestinal:  Negative for abdominal pain, constipation, diarrhea and heartburn.  Genitourinary:  Negative for dysuria, frequency and urgency.  Musculoskeletal:  Negative for back pain, falls, joint pain and myalgias.  Skin: Negative.   Neurological:  Negative for dizziness and headaches.  Psychiatric/Behavioral:  Negative for  depression and memory loss. The patient does not have insomnia.     Past Medical History:  Diagnosis Date   Allergy    Anemia    Diverticulosis    GERD (gastroesophageal reflux disease)    High blood pressure    Leg pain    Numbness    Perimenopause    Tubular adenoma of colon    Past Surgical History:  Procedure Laterality Date   BREAST EXCISIONAL BIOPSY Left    30 years ago; benign   Cyst removed from Left Breast  [place]     Uterine abalation   TUBAL LIGATION     WISDOM TOOTH EXTRACTION     Social History:   reports that she has never smoked. She has never used smokeless tobacco. She reports current alcohol use of about 6.0 standard drinks of alcohol per week. She reports that she does not use drugs.  Family History  Problem Relation Age of Onset   Cancer Father        lung cancer   Hypertension Father    Hypertension Mother    Arthritis Mother    Cancer Maternal Grandmother    COPD Paternal Aunt    Alzheimer's disease Paternal Grandmother    Colon cancer Neg Hx    Esophageal cancer Neg Hx    Liver cancer Neg Hx    Pancreatic cancer Neg Hx    Rectal cancer Neg Hx    Stomach cancer Neg Hx     Medications: Patient's Medications  New Prescriptions   No medications  on file  Previous Medications   ACETAMINOPHEN (TYLENOL) 500 MG TABLET    Take 500 mg by mouth daily. Patient takes it if not taking Goodys powder   ASCORBIC ACID (VITAMIN C PO)    Take 1 tablet by mouth daily.   ASPIRIN-ACETAMINOPHEN-CAFFEINE (GOODYS EXTRA STRENGTH PO)    Take 1 packet by mouth as needed (For headache).   CETIRIZINE (ZYRTEC) 10 MG TABLET    Take 10 mg by mouth daily.   CHOLECALCIFEROL (VITAMIN D3) 2000 UNITS TABS    Take 2,000 Units by mouth daily.   FLUTICASONE (FLONASE) 50 MCG/ACT NASAL SPRAY    SHAKE LIQUID AND USE 1 SPRAY IN EACH NOSTRIL DAILY   GABAPENTIN (NEURONTIN) 300 MG CAPSULE    Take 1-2 capsules (300-600 mg total) by mouth 3 (three) times  daily. Can take up to 4 pills at bedtime prn   IBUPROFEN (ADVIL) 800 MG TABLET    TAKE 1 TABLET(800 MG) BY MOUTH DAILY AS NEEDED FOR MILD PAIN OR MODERATE PAIN   MONTELUKAST (SINGULAIR) 10 MG TABLET    Take 1 tablet (10 mg total) by mouth at bedtime.   OLMESARTAN-HYDROCHLOROTHIAZIDE (BENICAR HCT) 40-25 MG TABLET    Take 1 tablet by mouth daily.   TACROLIMUS (PROTOPIC) 0.1 % OINTMENT    Apply topically as needed.   ZOLPIDEM (AMBIEN) 5 MG TABLET    Take 5 mg by mouth at bedtime as needed.  Modified Medications   No medications on file  Discontinued Medications   METHYLPREDNISOLONE (MEDROL DOSEPAK) 4 MG TBPK TABLET    Take pills daily all together with food. Take the first dose (6 pills) as soon as possible. Take the rest each morning. For 6 days total 6-5-4-3-2-1.   UNABLE TO FIND    Med Name: Neuriva Plus 1 capsule daily for brain function    Physical Exam:  Vitals:   07/19/22 1407  BP: (!) 132/90  Pulse: 85  Temp: (!) 97.1 F (36.2 C)  TempSrc: Temporal  SpO2: 96%  Weight: 166 lb 9.6 oz (75.6 kg)  Height: 5' 3"  (1.6 m)   Body mass index is 29.51 kg/m. Wt Readings from Last 3 Encounters:  07/19/22 166 lb 9.6 oz (75.6 kg)  05/12/22 164 lb 2 oz (74.4 kg)  03/17/22 166 lb 1.6 oz (75.3 kg)    Physical Exam Constitutional:      General: She is not in acute distress.    Appearance: She is well-developed. She is not diaphoretic.  HENT:     Head: Normocephalic and atraumatic.     Mouth/Throat:     Pharynx: No oropharyngeal exudate.  Eyes:     Conjunctiva/sclera: Conjunctivae normal.     Pupils: Pupils are equal, round, and reactive to light.  Cardiovascular:     Rate and Rhythm: Normal rate and regular rhythm.     Heart sounds: Normal heart sounds.  Pulmonary:     Effort: Pulmonary effort is normal.     Breath sounds: Normal breath sounds.  Abdominal:     General: Bowel sounds are normal.     Palpations: Abdomen is soft.  Musculoskeletal:     Cervical back: Normal range  of motion and neck supple.     Right lower leg: No edema.     Left lower leg: No edema.  Skin:    General: Skin is warm and dry.  Neurological:     Mental Status: She is alert.  Psychiatric:        Mood and  Affect: Mood normal.     Labs reviewed: Basic Metabolic Panel: Recent Labs    12/28/21 1625  NA 139  K 4.3  CL 102  CO2 28  GLUCOSE 94  BUN 25  CREATININE 0.68  CALCIUM 10.8*   Liver Function Tests: Recent Labs    12/28/21 1625  AST 24  ALT 22  BILITOT 0.5  PROT 8.1   No results for input(s): "LIPASE", "AMYLASE" in the last 8760 hours. No results for input(s): "AMMONIA" in the last 8760 hours. CBC: Recent Labs    12/28/21 1625  WBC 6.7  NEUTROABS 3,209  HGB 12.2  HCT 35.8  MCV 100.0  PLT 294   Lipid Panel: Recent Labs    12/28/21 1625  CHOL 235*  HDL 102  LDLCALC 116*  TRIG 73  CHOLHDL 2.3   TSH: No results for input(s): "TSH" in the last 8760 hours. A1C: Lab Results  Component Value Date   HGBA1C 5.3 06/19/2014     Assessment/Plan 1. Seasonal allergic rhinitis due to pollen -stable, will continue current regimen.  - montelukast (SINGULAIR) 10 MG tablet; Take 1 tablet (10 mg total) by mouth at bedtime.  Dispense: 90 tablet; Refill: 3  2. Right lumbosacral radiculopathy -improved after injection, also has gabapentin to use PRN  3. Benign essential HTN Slightly elevated today -dietary modifications encouraged -will have her monitor BP at home -continues medication as prescribed - CBC with Differential/Platelet - CMP with eGFR([facility])  4. Overweight with body mass index (BMI) 25.0-29.9 --education provided on healthy weight loss through increase in physical activity and proper nutrition   5. Primary insomnia Well controlled at this time.   6. Hyperlipidemia LDL goal <100 -dietary modifications encouraged.  - CMP with eGFR([facility]) - Lipid panel  Return in about 6 months (around 01/17/2023) for routine follow up . Anita Brandt.  [place], [facility] [phone]

## 2022-07-20 LAB — COMPLETE METABOLIC PANEL WITH GFR
AG Ratio: 1.4 (calc) (ref 1.0–2.5)
ALT: 20 U/L (ref 6–29)
AST: 20 U/L (ref 10–35)
Albumin: 4.6 g/dL (ref 3.6–5.1)
Alkaline phosphatase (APISO): 57 U/L (ref 37–153)
BUN: 24 mg/dL (ref 7–25)
CO2: 29 mmol/L (ref 20–32)
Calcium: 10.2 mg/dL (ref 8.6–10.4)
Chloride: 100 mmol/L (ref 98–110)
Creat: 0.76 mg/dL (ref 0.50–1.03)
Globulin: 3.3 g/dL (calc) (ref 1.9–3.7)
Glucose, Bld: 92 mg/dL (ref 65–139)
Potassium: 4.4 mmol/L (ref 3.5–5.3)
Sodium: 139 mmol/L (ref 135–146)
Total Bilirubin: 0.5 mg/dL (ref 0.2–1.2)
Total Protein: 7.9 g/dL (ref 6.1–8.1)
eGFR: 92 mL/min/{1.73_m2} (ref >=60)

## 2022-07-20 LAB — CBC WITH DIFFERENTIAL/PLATELET
Absolute Monocytes: 696 cells/uL (ref 200–950)
Basophils Absolute: 20 cells/uL (ref 0–200)
Basophils Relative: 0.3 %
Eosinophils Absolute: 20 cells/uL (ref 15–500)
Eosinophils Relative: 0.3 %
HCT: 37.1 % (ref 35.0–45.0)
Hemoglobin: 12.9 g/dL (ref 11.7–15.5)
Lymphs Abs: 2054 cells/uL (ref 850–3900)
MCH: 34.4 pg — ABNORMAL HIGH (ref 27.0–33.0)
MCHC: 34.8 g/dL (ref 32.0–36.0)
MCV: 98.9 fL (ref 80.0–100.0)
MPV: 10.2 fL (ref 7.5–12.5)
Monocytes Relative: 10.7 %
Neutro Abs: 3712 cells/uL (ref 1500–7800)
Neutrophils Relative %: 57.1 %
Platelets: 288 10*3/uL (ref 140–400)
RBC: 3.75 10*6/uL — ABNORMAL LOW (ref 3.80–5.10)
RDW: 12.4 % (ref 11.0–15.0)
Total Lymphocyte: 31.6 %
WBC: 6.5 10*3/uL (ref 3.8–10.8)

## 2022-07-20 LAB — LIPID PANEL
Cholesterol: 235 mg/dL — ABNORMAL HIGH (ref <200)
HDL: 108 mg/dL (ref >=50)
LDL Cholesterol (Calc): 113 mg/dL (calc) — ABNORMAL HIGH
Non-HDL Cholesterol (Calc): 127 mg/dL (calc) (ref <130)
Total CHOL/HDL Ratio: 2.2 (calc) (ref <5.0)
Triglycerides: 59 mg/dL (ref <150)

## 2022-07-26 ENCOUNTER — Other Ambulatory Visit: Payer: Self-pay

## 2022-07-26 MED ORDER — OLMESARTAN MEDOXOMIL-HCTZ 40-25 MG PO TABS: 1.0000 | ORAL_TABLET | Freq: Every day | ORAL | 3 refills | Status: DC

## 2022-12-20 ENCOUNTER — Encounter: Payer: Self-pay | Admitting: Adult Health

## 2022-12-20 ENCOUNTER — Ambulatory Visit: Payer: 59 | Admitting: Adult Health

## 2022-12-20 VITALS — BP 148/78 | HR 97 | Temp 97.5°F | Resp 18 | Ht 63.0 in | Wt 169.0 lb

## 2022-12-20 DIAGNOSIS — I10 Essential (primary) hypertension: Secondary | ICD-10-CM

## 2022-12-20 DIAGNOSIS — M5417 Radiculopathy, lumbosacral region: Secondary | ICD-10-CM

## 2022-12-20 DIAGNOSIS — F5101 Primary insomnia: Secondary | ICD-10-CM

## 2022-12-20 MED ORDER — METOPROLOL TARTRATE 25 MG PO TABS: 12.5000 mg | ORAL_TABLET | Freq: Two times a day (BID) | ORAL | 3 refills | Status: DC

## 2022-12-20 NOTE — Progress Notes (Signed)
Location:  [facility]  Provider: Durenda Age DNP  Code Status:  Full Code  Goals of Care:     07/19/2022    3:12 PM  Advanced Directives  Does Patient Have a Medical Advance Directive? No  Would patient like information on creating a medical advance directive? Yes (MAU/Ambulatory/Procedural Areas - Information given)     Chief Complaint  Patient presents with   Acute Visit    Elevated Blood Pressure    HPI: Patient is a 57 y.o. female seen today for an acute visit regarding elevated Bps. She stated that her Bps at home were 155/96. Today, at the clinic, BP was 148/78 and HR 97. She has occasional headache. She takes Olmesartan HCTZ 40-25 mg daily for hypertension. 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 She denies SOB. She takes Gabapentin for lumbar radiculopathy. Se denies back pain today.   Past Medical History:  Diagnosis Date   Allergy    Anemia    Diverticulosis    GERD (gastroesophageal reflux disease)    High blood pressure    Leg pain    Numbness    Perimenopause    Tubular adenoma of colon     Past Surgical History:  Procedure Laterality Date   BREAST EXCISIONAL BIOPSY Left    30 years ago; benign   Cyst removed from Left Breast  1984   DILATION AND CURETTAGE OF UTERUS     Uterine abalation   TUBAL LIGATION     WISDOM TOOTH EXTRACTION      Allergies  Allergen Reactions   Penicillin G Potassium In D5w     hives   Sulfa Antibiotics     hives    Outpatient Encounter Medications as of 12/20/2022  Medication Sig   acetaminophen (TYLENOL) 500 MG tablet Take 500 mg by mouth daily. Patient takes it if not taking Goodys powder   Ascorbic Acid (VITAMIN C PO) Take 1 tablet by mouth daily.   Aspirin-Acetaminophen-Caffeine (GOODYS EXTRA STRENGTH PO) Take 1 packet by mouth as needed (For headache).   cetirizine (ZYRTEC) 10 MG tablet Take 10 mg by mouth daily.   Cholecalciferol (VITAMIN D3) 2000  units TABS Take 2,000 Units by mouth daily.   fluticasone (FLONASE) 50 MCG/ACT nasal spray SHAKE LIQUID AND USE 1 SPRAY IN EACH NOSTRIL DAILY   gabapentin (NEURONTIN) 300 MG capsule Take 1-2 capsules (300-600 mg total) by mouth 3 (three) times daily. Can take up to 4 pills at bedtime prn   ibuprofen (ADVIL) 800 MG tablet TAKE 1 TABLET(800 MG) BY MOUTH DAILY AS NEEDED FOR MILD PAIN OR MODERATE PAIN   montelukast (SINGULAIR) 10 MG tablet Take 1 tablet (10 mg total) by mouth at bedtime.   olmesartan-hydrochlorothiazide (BENICAR HCT) 40-25 MG tablet Take 1 tablet by mouth daily.   tacrolimus (PROTOPIC) 0.1 % ointment Apply topically as needed.   zolpidem (AMBIEN) 5 MG tablet Take 5 mg by mouth at bedtime as needed.   No facility-administered encounter medications on file as of 12/20/2022.    Review of Systems:  Review of Systems  Constitutional:  Negative for appetite change, chills, fatigue and fever.  HENT:  Negative for congestion, hearing loss, rhinorrhea and sore throat.   Eyes: Negative.   Respiratory:  Negative for cough, shortness of breath and wheezing.   Cardiovascular:  Negative for chest pain, palpitations and leg swelling.  Gastrointestinal:  Negative for abdominal pain, constipation, diarrhea, nausea and  vomiting.  Genitourinary:  Negative for dysuria.  Musculoskeletal:  Negative for arthralgias, back pain and myalgias.  Skin:  Negative for color change, rash and wound.  Neurological:  Negative for dizziness, weakness and headaches.  Psychiatric/Behavioral:  Negative for behavioral problems. The patient is not nervous/anxious.     Health Maintenance  Topic Date Due   DTaP/Tdap/Td (1 - Tdap) Never done   Zoster Vaccines- Shingrix (1 of 2) Never done   MAMMOGRAM  03/10/2021   COVID-19 Vaccine (5 - 2023-24 season) 06/25/2022   COLONOSCOPY (Pts 45-60yr Insurance coverage will need to be confirmed)  11/29/2022   PAP SMEAR-Modifier  01/11/2023   INFLUENZA VACCINE  01/23/2023  (Originally 05/25/2022)   Hepatitis C Screening  Completed   HIV Screening  Completed   HPV VACCINES  Aged Out    Physical Exam: Vitals:   12/20/22 1407  Pulse: 97  Temp: (!) 97.5 F (36.4 C)   Body mass index is 29.94 kg/m. Physical Exam Constitutional:      Appearance: She is obese.  HENT:     Head: Normocephalic and atraumatic.     Nose: Nose normal.     Mouth/Throat:     Mouth: Mucous membranes are moist.  Eyes:     Conjunctiva/sclera: Conjunctivae normal.  Cardiovascular:     Rate and Rhythm: Normal rate and regular rhythm.  Pulmonary:     Effort: Pulmonary effort is normal.     Breath sounds: Normal breath sounds.  Abdominal:     General: Bowel sounds are normal.     Palpations: Abdomen is soft.  Musculoskeletal:        General: Normal range of motion.     Cervical back: Normal range of motion.  Skin:    General: Skin is warm and dry.  Neurological:     General: No focal deficit present.     Mental Status: She is alert and oriented to person, place, and time.  Psychiatric:        Mood and Affect: Mood normal.        Behavior: Behavior normal.        Thought Content: Thought content normal.        Judgment: Judgment normal.     Labs reviewed: Basic Metabolic Panel: Recent Labs    12/28/21 1625 07/19/22 1537  NA 139 139  K 4.3 4.4  CL 102 100  CO2 28 29  GLUCOSE 94 92  BUN 25 24  CREATININE 0.68 0.76  CALCIUM 10.8* 10.2   Liver Function Tests: Recent Labs    12/28/21 1625 07/19/22 1537  AST 24 20  ALT 22 20  BILITOT 0.5 0.5  PROT 8.1 7.9   No results for input(s): "LIPASE", "AMYLASE" in the last 8760 hours. No results for input(s): "AMMONIA" in the last 8760 hours. CBC: Recent Labs    12/28/21 1625 07/19/22 1537  WBC 6.7 6.5  NEUTROABS 3,209 3,712  HGB 12.2 12.9  HCT 35.8 37.1  MCV 100.0 98.9  PLT 294 288   Lipid Panel: Recent Labs    12/28/21 1625 07/19/22 1537  CHOL 235* 235*  HDL 102 108  LDLCALC 116* 113*  TRIG 73  59  CHOLHDL 2.3 2.2   Lab Results  Component Value Date   HGBA1C 5.3 06/19/2014    Procedures since last visit: No results found.  Assessment/Plan  1. Benign essential HTN -  continue Olmesartan-HCTZ and will add Metoprolol tartrate -  instructed to check BP, log and bring to  next appointment - metoprolol tartrate (LOPRESSOR) 25 MG tablet; Take 0.5 tablets (12.5 mg total) by mouth 2 (two) times daily.  Dispense: 60 tablet; Refill: 3 -  plans to do weight loss and start exercising  2. Primary insomnia -  takes Ambien PRN  3. Left lumbosacral radiculopathy -  stable, continue Gabapentin   Labs/tests ordered:  None  Next appt:  01/17/2023

## 2022-12-20 NOTE — Patient Instructions (Signed)
Hypertension, Adult High blood pressure (hypertension) is when the force of blood pumping through the arteries is too strong. The arteries are the blood vessels that carry blood from the heart throughout the body. Hypertension forces the heart to work harder to pump blood and may cause arteries to become narrow or stiff. Untreated or uncontrolled hypertension can lead to a heart attack, heart failure, a stroke, kidney disease, and other problems. A blood pressure reading consists of a higher number over a lower number. Ideally, your blood pressure should be below 120/80. The first ("top") number is called the systolic pressure. It is a measure of the pressure in your arteries as your heart beats. The second ("bottom") number is called the diastolic pressure. It is a measure of the pressure in your arteries as the heart relaxes. What are the causes? The exact cause of this condition is not known. There are some conditions that result in high blood pressure. What increases the risk? Certain factors may make you more likely to develop high blood pressure. Some of these risk factors are under your control, including: Smoking. Not getting enough exercise or physical activity. Being overweight. Having too much fat, sugar, calories, or salt (sodium) in your diet. Drinking too much alcohol. Other risk factors include: Having a personal history of heart disease, diabetes, high cholesterol, or kidney disease. Stress. Having a family history of high blood pressure and high cholesterol. Having obstructive sleep apnea. Age. The risk increases with age. What are the signs or symptoms? High blood pressure may not cause symptoms. Very high blood pressure (hypertensive crisis) may cause: Headache. Fast or irregular heartbeats (palpitations). Shortness of breath. Nosebleed. Nausea and vomiting. Vision changes. Severe chest pain, dizziness, and seizures. How is this diagnosed? This condition is diagnosed by  measuring your blood pressure while you are seated, with your arm resting on a flat surface, your legs uncrossed, and your feet flat on the floor. The cuff of the blood pressure monitor will be placed directly against the skin of your upper arm at the level of your heart. Blood pressure should be measured at least twice using the same arm. Certain conditions can cause a difference in blood pressure between your right and left arms. If you have a high blood pressure reading during one visit or you have normal blood pressure with other risk factors, you may be asked to: Return on a different day to have your blood pressure checked again. Monitor your blood pressure at home for 1 week or longer. If you are diagnosed with hypertension, you may have other blood or imaging tests to help your health care provider understand your overall risk for other conditions. How is this treated? This condition is treated by making healthy lifestyle changes, such as eating healthy foods, exercising more, and reducing your alcohol intake. You may be referred for counseling on a healthy diet and physical activity. Your health care provider may prescribe medicine if lifestyle changes are not enough to get your blood pressure under control and if: Your systolic blood pressure is above 130. Your diastolic blood pressure is above 80. Your personal target blood pressure may vary depending on your medical conditions, your age, and other factors. Follow these instructions at home: Eating and drinking  Eat a diet that is high in fiber and potassium, and low in sodium, added sugar, and fat. An example of this eating plan is called the DASH diet. DASH stands for Dietary Approaches to Stop Hypertension. To eat this way: Eat   plenty of fresh fruits and vegetables. Try to fill one half of your plate at each meal with fruits and vegetables. Eat whole grains, such as whole-wheat pasta, brown rice, or whole-grain bread. Fill about one  fourth of your plate with whole grains. Eat or drink low-fat dairy products, such as skim milk or low-fat yogurt. Avoid fatty cuts of meat, processed or cured meats, and poultry with skin. Fill about one fourth of your plate with lean proteins, such as fish, chicken without skin, beans, eggs, or tofu. Avoid pre-made and processed foods. These tend to be higher in sodium, added sugar, and fat. Reduce your daily sodium intake. Many people with hypertension should eat less than 1,500 mg of sodium a day. Do not drink alcohol if: Your health care provider tells you not to drink. You are pregnant, may be pregnant, or are planning to become pregnant. If you drink alcohol: Limit how much you have to: 0-1 drink a day for women. 0-2 drinks a day for men. Know how much alcohol is in your drink. In the [place] one drink equals one 12 oz bottle of beer (355 mL), one 5 oz glass of wine (148 mL), or one 1 oz glass of hard liquor (44 mL). Lifestyle  Work with your health care provider to maintain a healthy body weight or to lose weight. Ask what an ideal weight is for you. Get at least 30 minutes of exercise that causes your heart to beat faster (aerobic exercise) most days of the week. Activities may include walking, swimming, or biking. Include exercise to strengthen your muscles (resistance exercise), such as Pilates or lifting weights, as part of your weekly exercise routine. Try to do these types of exercises for 30 minutes at least 3 days a week. Do not use any products that contain nicotine or tobacco. These products include cigarettes, chewing tobacco, and vaping devices, such as e-cigarettes. If you need help quitting, ask your health care provider. Monitor your blood pressure at home as told by your health care provider. Keep all follow-up visits. This is important. Medicines Take over-the-counter and prescription medicines only as told by your health care provider. Follow directions carefully. Blood  pressure medicines must be taken as prescribed. Do not skip doses of blood pressure medicine. Doing this puts you at risk for problems and can make the medicine less effective. Ask your health care provider about side effects or reactions to medicines that you should watch for. Contact a health care provider if you: Think you are having a reaction to a medicine you are taking. Have headaches that keep coming back (recurring). Feel dizzy. Have swelling in your ankles. Have trouble with your vision. Get help right away if you: Develop a severe headache or confusion. Have unusual weakness or numbness. Feel faint. Have severe pain in your chest or abdomen. Vomit repeatedly. Have trouble breathing. These symptoms may be an emergency. Get help right away. Call 911. Do not wait to see if the symptoms will go away. Do not drive yourself to the hospital. Summary Hypertension is when the force of blood pumping through your arteries is too strong. If this condition is not controlled, it may put you at risk for serious complications. Your personal target blood pressure may vary depending on your medical conditions, your age, and other factors. For most people, a normal blood pressure is less than 120/80. Hypertension is treated with lifestyle changes, medicines, or a combination of both. Lifestyle changes include losing weight, eating a healthy,   low-sodium diet, exercising more, and limiting alcohol. This information is not intended to replace advice given to you by your health care provider. Make sure you discuss any questions you have with your health care provider. Document Revised: 08/18/2021 Document Reviewed: 08/18/2021 Elsevier Patient Education  2023 [employer]  

## 2023-01-17 ENCOUNTER — Ambulatory Visit: Payer: 59 | Admitting: Nurse Practitioner

## 2023-01-17 ENCOUNTER — Encounter: Payer: Self-pay | Admitting: Nurse Practitioner

## 2023-01-17 VITALS — BP 112/72 | HR 88 | Temp 97.7°F | Resp 16 | Ht 63.0 in | Wt 168.8 lb

## 2023-01-17 DIAGNOSIS — J301 Allergic rhinitis due to pollen: Secondary | ICD-10-CM | POA: Diagnosis not present

## 2023-01-17 DIAGNOSIS — F419 Anxiety disorder, unspecified: Secondary | ICD-10-CM

## 2023-01-17 DIAGNOSIS — M5417 Radiculopathy, lumbosacral region: Secondary | ICD-10-CM | POA: Diagnosis not present

## 2023-01-17 DIAGNOSIS — I10 Essential (primary) hypertension: Secondary | ICD-10-CM | POA: Diagnosis not present

## 2023-01-17 DIAGNOSIS — E785 Hyperlipidemia, unspecified: Secondary | ICD-10-CM

## 2023-01-17 MED ORDER — ESCITALOPRAM OXALATE 10 MG PO TABS: 10.0000 mg | ORAL_TABLET | Freq: Every day | ORAL | 1 refills | Status: DC

## 2023-01-17 MED ORDER — AMLODIPINE BESYLATE 5 MG PO TABS: 5.0000 mg | ORAL_TABLET | Freq: Every day | ORAL | 1 refills | Status: AC

## 2023-01-17 NOTE — Patient Instructions (Addendum)
To start lexapro 5 mg daily for 2 weeks then increase to 10 mg daily  For mood.

## 2023-01-17 NOTE — Progress Notes (Unsigned)
Careteam: Patient Care Team: Lauree Chandler, NP as PCP - General (Geriatric Medicine) Janne Napoleon, PA-C (Inactive) (Dermatology) Loney Loh, MD (Dermatology) Everlene Farrier, MD as Consulting Physician (Obstetrics and Gynecology)  PLACE OF SERVICE:  [facility]  Advanced Directive information    Allergies  Allergen Reactions   Penicillin G Potassium In D5w     hives   Sulfa Antibiotics     hives    No chief complaint on file.    HPI: Patient is a 57 y.o. female for routine follow up.  Lots of stress at work. She is currently in therapy due to the stress. Having more anxiety.  Reports she is generally calm  but keeps it in.    She was seen last month due to elevated blood pressure but feels metoprolol is zapping her energy. No chest pains, shortness of breath noted.   Sleep is stable- never sleeps "good" but doing okay. Does not take ambien.   Review of Systems:  Review of Systems  Constitutional:  Positive for malaise/fatigue. Negative for chills, fever and weight loss.  HENT:  Negative for tinnitus.   Respiratory:  Negative for cough, sputum production and shortness of breath.   Cardiovascular:  Negative for chest pain, palpitations and leg swelling.  Gastrointestinal:  Negative for abdominal pain, constipation, diarrhea and heartburn.  Genitourinary:  Negative for dysuria, frequency and urgency.  Musculoskeletal:  Negative for back pain, falls, joint pain and myalgias.  Skin: Negative.   Neurological:  Negative for dizziness and headaches.  Psychiatric/Behavioral:  Negative for depression and memory loss. The patient is nervous/anxious. The patient does not have insomnia.     Past Medical History:  Diagnosis Date   Allergy    Anemia    Diverticulosis    GERD (gastroesophageal reflux disease)    High blood pressure    Leg pain    Numbness    Perimenopause    Tubular adenoma of colon    Past Surgical History:  Procedure Laterality  Date   BREAST EXCISIONAL BIOPSY Left    30 years ago; benign   Cyst removed from Left Breast  [place]     Uterine abalation   TUBAL LIGATION     WISDOM TOOTH EXTRACTION     Social History:   reports that she has never smoked. She has never used smokeless tobacco. She reports current alcohol use of about 6.0 standard drinks of alcohol per week. She reports that she does not use drugs.  Family History  Problem Relation Age of Onset   Cancer Father        lung cancer   Hypertension Father    Hypertension Mother    Arthritis Mother    Cancer Maternal Grandmother    COPD Paternal Aunt    Alzheimer's disease Paternal Grandmother    Colon cancer Neg Hx    Esophageal cancer Neg Hx    Liver cancer Neg Hx    Pancreatic cancer Neg Hx    Rectal cancer Neg Hx    Stomach cancer Neg Hx     Medications: Patient's Medications  New Prescriptions   AMLODIPINE (NORVASC) 5 MG TABLET    Take 1 tablet (5 mg total) by mouth daily.   ESCITALOPRAM (LEXAPRO) 10 MG TABLET    Take 1 tablet (10 mg total) by mouth daily.  Previous Medications   ACETAMINOPHEN (TYLENOL) 500 MG TABLET    Take 500 mg by mouth daily.  Patient takes it if not taking Goodys powder   ASCORBIC ACID (VITAMIN C PO)    Take 1 tablet by mouth daily.   ASPIRIN-ACETAMINOPHEN-CAFFEINE (GOODYS EXTRA STRENGTH PO)    Take 1 packet by mouth as needed (For headache).   CETIRIZINE (ZYRTEC) 10 MG TABLET    Take 10 mg by mouth daily.   CHOLECALCIFEROL (VITAMIN D3) 2000 UNITS TABS    Take 2,000 Units by mouth daily.   FLUTICASONE (FLONASE) 50 MCG/ACT NASAL SPRAY    SHAKE LIQUID AND USE 1 SPRAY IN EACH NOSTRIL DAILY   GABAPENTIN (NEURONTIN) 300 MG CAPSULE    Take 1-2 capsules (300-600 mg total) by mouth 3 (three) times daily. Can take up to 4 pills at bedtime prn   IBUPROFEN (ADVIL) 800 MG TABLET    TAKE 1 TABLET(800 MG) BY MOUTH DAILY AS NEEDED FOR MILD PAIN OR MODERATE PAIN   METOPROLOL TARTRATE (LOPRESSOR) 25 MG  TABLET    Take 0.5 tablets (12.5 mg total) by mouth 2 (two) times daily.   MONTELUKAST (SINGULAIR) 10 MG TABLET    Take 1 tablet (10 mg total) by mouth at bedtime.   OLMESARTAN-HYDROCHLOROTHIAZIDE (BENICAR HCT) 40-25 MG TABLET    Take 1 tablet by mouth daily.   TACROLIMUS (PROTOPIC) 0.1 % OINTMENT    Apply topically as needed.  Modified Medications   No medications on file  Discontinued Medications   ZOLPIDEM (AMBIEN) 5 MG TABLET    Take 5 mg by mouth at bedtime as needed.    Physical Exam:  Vitals:   01/17/23 1517  BP: 112/72  Pulse: 88  Resp: 16  Temp: 97.7 F (36.5 C)  TempSrc: Temporal  SpO2: 98%  Weight: 168 lb 12.8 oz (76.6 kg)  Height: 5\' 3"  (1.6 m)   Body mass index is 29.9 kg/m. Wt Readings from Last 3 Encounters:  01/17/23 168 lb 12.8 oz (76.6 kg)  12/20/22 169 lb (76.7 kg)  07/19/22 166 lb 9.6 oz (75.6 kg)    Physical Exam Constitutional:      General: She is not in acute distress.    Appearance: She is well-developed. She is not diaphoretic.  HENT:     Head: Normocephalic and atraumatic.     Mouth/Throat:     Pharynx: No oropharyngeal exudate.  Eyes:     Conjunctiva/sclera: Conjunctivae normal.     Pupils: Pupils are equal, round, and reactive to light.  Cardiovascular:     Rate and Rhythm: Normal rate and regular rhythm.     Heart sounds: Normal heart sounds.  Pulmonary:     Effort: Pulmonary effort is normal.     Breath sounds: Normal breath sounds.  Abdominal:     General: Bowel sounds are normal.     Palpations: Abdomen is soft.  Musculoskeletal:     Cervical back: Normal range of motion and neck supple.     Right lower leg: No edema.     Left lower leg: No edema.  Skin:    General: Skin is warm and dry.  Neurological:     Mental Status: She is alert.  Psychiatric:        Mood and Affect: Mood normal.     Labs reviewed: Basic Metabolic Panel: Recent Labs    07/19/22 1537 01/17/23 1535  NA 139 140  K 4.4 4.0  CL 100 103  CO2 29  25  GLUCOSE 92 94  BUN 24 26*  CREATININE 0.76 0.80  CALCIUM 10.2 10.0   Liver  Function Tests: Recent Labs    07/19/22 1537 01/17/23 1535  AST 20 24  ALT 20 24  BILITOT 0.5 0.5  PROT 7.9 7.9   No results for input(s): "LIPASE", "AMYLASE" in the last 8760 hours. No results for input(s): "AMMONIA" in the last 8760 hours. CBC: Recent Labs    07/19/22 1537 01/17/23 1535  WBC 6.5 6.7  NEUTROABS 3,712 4,087  HGB 12.9 12.8  HCT 37.1 38.2  MCV 98.9 98.2  PLT 288 297   Lipid Panel: Recent Labs    07/19/22 1537 01/17/23 1535  CHOL 235* 222*  HDL 108 103  LDLCALC 113* 103*  TRIG 59 75  CHOLHDL 2.2 2.2   TSH: No results for input(s): "TSH" in the last 8760 hours. A1C: Lab Results  Component Value Date   HGBA1C 5.3 06/19/2014     Assessment/Plan 1. Benign essential HTN -metoprolol causing increase in fatigue. Will have her stop metoprolol and start amlodipine.  Continues on benicar HCTZ Goal BP <140/90, to monitor at home.  Low sodium diet.  - amLODipine (NORVASC) 5 MG tablet; Take 1 tablet (5 mg total) by mouth daily.  Dispense: 90 tablet; Refill: 1 - COMPLETE METABOLIC PANEL WITH GFR - CBC with Differential/Platelet  2. Seasonal allergic rhinitis due to pollen Continues on singulair daily  3. Right lumbosacral radiculopathy Overall stable, she has not needed gabapentin.   4. Anxiety -worsening anxiety with increase stress at work.  Discussed finding therapeutic ways to reduce stress. She is seeing therapist at this time.  - escitalopram (LEXAPRO) 10 MG tablet; Take 1 tablet (10 mg total) by mouth daily.  Dispense: 30 tablet; Refill: 1  5. Hyperlipidemia LDL goal <100 -continues dietary modifications.  - Lipid panel   Return in about 6 weeks (around 02/28/2023) for mood .and BP  Brydon Spahr K. [place], [facility] [phone]

## 2023-01-18 LAB — CBC WITH DIFFERENTIAL/PLATELET
Absolute Monocytes: 670 cells/uL (ref 200–950)
Basophils Absolute: 20 cells/uL (ref 0–200)
Basophils Relative: 0.3 %
Eosinophils Absolute: 20 cells/uL (ref 15–500)
Eosinophils Relative: 0.3 %
HCT: 38.2 % (ref 35.0–45.0)
Hemoglobin: 12.8 g/dL (ref 11.7–15.5)
Lymphs Abs: 1903 cells/uL (ref 850–3900)
MCH: 32.9 pg (ref 27.0–33.0)
MCHC: 33.5 g/dL (ref 32.0–36.0)
MCV: 98.2 fL (ref 80.0–100.0)
MPV: 10.3 fL (ref 7.5–12.5)
Monocytes Relative: 10 %
Neutro Abs: 4087 cells/uL (ref 1500–7800)
Neutrophils Relative %: 61 %
Platelets: 297 10*3/uL (ref 140–400)
RBC: 3.89 10*6/uL (ref 3.80–5.10)
RDW: 12.3 % (ref 11.0–15.0)
Total Lymphocyte: 28.4 %
WBC: 6.7 10*3/uL (ref 3.8–10.8)

## 2023-01-18 LAB — LIPID PANEL
Cholesterol: 222 mg/dL — ABNORMAL HIGH (ref <200)
HDL: 103 mg/dL (ref >=50)
LDL Cholesterol (Calc): 103 mg/dL (calc) — ABNORMAL HIGH
Non-HDL Cholesterol (Calc): 119 mg/dL (calc) (ref <130)
Total CHOL/HDL Ratio: 2.2 (calc) (ref <5.0)
Triglycerides: 75 mg/dL (ref <150)

## 2023-01-18 LAB — COMPLETE METABOLIC PANEL WITH GFR
AG Ratio: 1.5 (calc) (ref 1.0–2.5)
ALT: 24 U/L (ref 6–29)
AST: 24 U/L (ref 10–35)
Albumin: 4.7 g/dL (ref 3.6–5.1)
Alkaline phosphatase (APISO): 66 U/L (ref 37–153)
BUN/Creatinine Ratio: 33 (calc) — ABNORMAL HIGH (ref 6–22)
BUN: 26 mg/dL — ABNORMAL HIGH (ref 7–25)
CO2: 25 mmol/L (ref 20–32)
Calcium: 10 mg/dL (ref 8.6–10.4)
Chloride: 103 mmol/L (ref 98–110)
Creat: 0.8 mg/dL (ref 0.50–1.03)
Globulin: 3.2 g/dL (calc) (ref 1.9–3.7)
Glucose, Bld: 94 mg/dL (ref 65–139)
Potassium: 4 mmol/L (ref 3.5–5.3)
Sodium: 140 mmol/L (ref 135–146)
Total Bilirubin: 0.5 mg/dL (ref 0.2–1.2)
Total Protein: 7.9 g/dL (ref 6.1–8.1)
eGFR: 87 mL/min/{1.73_m2} (ref >=60)

## 2023-02-28 ENCOUNTER — Telehealth: Payer: 59 | Admitting: Nurse Practitioner

## 2023-03-01 ENCOUNTER — Telehealth (INDEPENDENT_AMBULATORY_CARE_PROVIDER_SITE_OTHER): Payer: 59 | Admitting: Nurse Practitioner

## 2023-03-01 ENCOUNTER — Encounter: Payer: Self-pay | Admitting: Nurse Practitioner

## 2023-03-01 ENCOUNTER — Telehealth: Payer: Self-pay | Admitting: *Deleted

## 2023-03-01 ENCOUNTER — Encounter: Payer: Self-pay | Admitting: Internal Medicine

## 2023-03-01 DIAGNOSIS — F419 Anxiety disorder, unspecified: Secondary | ICD-10-CM | POA: Diagnosis not present

## 2023-03-01 NOTE — Telephone Encounter (Signed)
Ms. Anita Brandt, pinkos are scheduled for a virtual visit with your provider today.    Just as we do with appointments in the office, we must obtain your consent to participate.  Your consent will be active for this visit and any virtual visit you Anita Brandt have with one of our providers in the next 365 days.    If you have a MyChart account, I can also send a copy of this consent to you electronically.  All virtual visits are billed to your insurance company just like a traditional visit in the office.  As this is a virtual visit, video technology does not allow for your provider to perform a traditional examination.  This Anita Brandt limit your provider's ability to fully assess your condition.  If your provider identifies any concerns that need to be evaluated in person or the need to arrange testing such as labs, EKG, etc, we will make arrangements to do so.    Although advances in technology are sophisticated, we cannot ensure that it will always work on either your end or our end.  If the connection with a video visit is poor, we Anita Brandt have to switch to a telephone visit.  With either a video or telephone visit, we are not always able to ensure that we have a secure connection.   I need to obtain your verbal consent now.   Are you willing to proceed with your visit today?   Anita Brandt has provided verbal consent on 03/01/2023 for a virtual visit (video or telephone).   Anita Brandt, Anita Brandt, Anita Brandt 03/01/2023  10:24 AM

## 2023-03-01 NOTE — Progress Notes (Signed)
  This service is provided via telemedicine  No vital signs collected/recorded due to the encounter was a telemedicine visit.   Location of patient (ex: home, work):  Home  Patient consents to a telephone visit:  Yes  Location of the provider (ex: office, home):  Office [place].   Name of any referring provider:  na  Names of all persons participating in the telemedicine service and their role in the encounter:  Sabino Dick, Patient, Teckla Pheng, CMA, Abbey Chatters, NP  Time spent on call:  7:23

## 2023-03-01 NOTE — Progress Notes (Signed)
Careteam: Patient Care Team: Sharon Seller, NP as PCP - General (Geriatric Medicine) Derenda Mis, PA-C (Inactive) (Dermatology) Basilio Cairo, MD (Dermatology) Harold Hedge, MD as Consulting Physician (Obstetrics and Gynecology)  Advanced Directive information Does Patient Have a Medical Advance Directive?: No, Would patient like information on creating a medical advance directive?: No - Patient declined  Allergies  Allergen Reactions   Penicillin G Potassium In D5w     hives   Sulfa Antibiotics     hives    Chief Complaint  Patient presents with   Acute Visit    Follow up Mood. Not currently taking Lexapro due to stepping away from Job.      HPI: Patient is a 57 y.o. female for follow up on mood.  She is on FMLA leave until June 10th for her mom.   She started lexapro for a week or 2 but then when she went out on leave she did not feel like she needed medication.   Feel like a burden has been lifted since she is on leave.  Reports her job is working on making things better and not as stressful.   Review of Systems:  Review of Systems  Constitutional:  Negative for chills, fever and malaise/fatigue.  Psychiatric/Behavioral:  Negative for depression. The patient is not nervous/anxious and does not have insomnia.     Past Medical History:  Diagnosis Date   Allergy    Anemia    Diverticulosis    GERD (gastroesophageal reflux disease)    High blood pressure    Leg pain    Numbness    Perimenopause    Tubular adenoma of colon    Past Surgical History:  Procedure Laterality Date   BREAST EXCISIONAL BIOPSY Left    30 years ago; benign   Cyst removed from Left Breast  1984   DILATION AND CURETTAGE OF UTERUS     Uterine abalation   TUBAL LIGATION     WISDOM TOOTH EXTRACTION     Social History:   reports that she has never smoked. She has never used smokeless tobacco. She reports current alcohol use of about 6.0 standard drinks of  alcohol per week. She reports that she does not use drugs.  Family History  Problem Relation Age of Onset   Cancer Father        lung cancer   Hypertension Father    Hypertension Mother    Arthritis Mother    Cancer Maternal Grandmother    COPD Paternal Aunt    Alzheimer's disease Paternal Grandmother    Colon cancer Neg Hx    Esophageal cancer Neg Hx    Liver cancer Neg Hx    Pancreatic cancer Neg Hx    Rectal cancer Neg Hx    Stomach cancer Neg Hx     Medications: Patient's Medications  New Prescriptions   No medications on file  Previous Medications   ACETAMINOPHEN (TYLENOL) 500 MG TABLET    Take 500 mg by mouth daily. Patient takes it if not taking Goodys powder   AMLODIPINE (NORVASC) 5 MG TABLET    Take 1 tablet (5 mg total) by mouth daily.   ASPIRIN-ACETAMINOPHEN-CAFFEINE (GOODYS EXTRA STRENGTH PO)    Take 1 packet by mouth as needed (For headache).   CETIRIZINE (ZYRTEC) 10 MG TABLET    Take 10 mg by mouth daily.   CHOLECALCIFEROL (VITAMIN D3) 2000 UNITS TABS    Take 2,000 Units by mouth daily.   ESCITALOPRAM (  LEXAPRO) 10 MG TABLET    Take 1 tablet (10 mg total) by mouth daily.   FLUTICASONE (FLONASE) 50 MCG/ACT NASAL SPRAY    SHAKE LIQUID AND USE 1 SPRAY IN EACH NOSTRIL DAILY   GABAPENTIN (NEURONTIN) 300 MG CAPSULE    Take 1-2 capsules (300-600 mg total) by mouth 3 (three) times daily. Can take up to 4 pills at bedtime prn   IBUPROFEN (ADVIL) 800 MG TABLET    TAKE 1 TABLET(800 MG) BY MOUTH DAILY AS NEEDED FOR MILD PAIN OR MODERATE PAIN   MONTELUKAST (SINGULAIR) 10 MG TABLET    Take 1 tablet (10 mg total) by mouth at bedtime.   OLMESARTAN-HYDROCHLOROTHIAZIDE (BENICAR HCT) 40-25 MG TABLET    Take 1 tablet by mouth daily.   TACROLIMUS (PROTOPIC) 0.1 % OINTMENT    Apply topically as needed.  Modified Medications   No medications on file  Discontinued Medications   ASCORBIC ACID (VITAMIN C PO)    Take 1 tablet by mouth daily.    Physical Exam:  There were no vitals  filed for this visit. There is no height or weight on file to calculate BMI. Wt Readings from Last 3 Encounters:  01/17/23 168 lb 12.8 oz (76.6 kg)  12/20/22 169 lb (76.7 kg)  07/19/22 166 lb 9.6 oz (75.6 kg)    Physical Exam Constitutional:      Appearance: Normal appearance.  Pulmonary:     Effort: Pulmonary effort is normal.  Neurological:     Mental Status: She is alert. Mental status is at baseline.  Psychiatric:        Mood and Affect: Mood normal.     Labs reviewed: Basic Metabolic Panel: Recent Labs    07/19/22 1537 01/17/23 1535  NA 139 140  K 4.4 4.0  CL 100 103  CO2 29 25  GLUCOSE 92 94  BUN 24 26*  CREATININE 0.76 0.80  CALCIUM 10.2 10.0   Liver Function Tests: Recent Labs    07/19/22 1537 01/17/23 1535  AST 20 24  ALT 20 24  BILITOT 0.5 0.5  PROT 7.9 7.9   No results for input(s): "LIPASE", "AMYLASE" in the last 8760 hours. No results for input(s): "AMMONIA" in the last 8760 hours. CBC: Recent Labs    07/19/22 1537 01/17/23 1535  WBC 6.5 6.7  NEUTROABS 3,712 4,087  HGB 12.9 12.8  HCT 37.1 38.2  MCV 98.9 98.2  PLT 288 297   Lipid Panel: Recent Labs    07/19/22 1537 01/17/23 1535  CHOL 235* 222*  HDL 108 103  LDLCALC 113* 409*  TRIG 59 75  CHOLHDL 2.2 2.2   TSH: No results for input(s): "TSH" in the last 8760 hours. A1C: Lab Results  Component Value Date   HGBA1C 5.3 06/19/2014     Assessment/Plan 1. Anxiety Improved now that she has stepped away from work. Did not continue lexapro Will continue to monitor.  Continues lifestyle modifications.   Janene Harvey. Biagio Borg  [facility] [phone]    Virtual Visit via Earleen Reaper   I connected with patient on 03/01/23 at  9:20 AM EDT by video and verified that I am speaking with the correct person using two identifiers.  Location: Patient: home Provider: [place]    I discussed the limitations, risks, security and privacy concerns of  performing an evaluation and management service by telephone and the availability of in person appointments. I also discussed with the patient that there may be a  patient responsible charge related to this service. The patient expressed understanding and agreed to proceed.   I discussed the assessment and treatment plan with the patient. The patient was provided an opportunity to ask questions and all were answered. The patient agreed with the plan and demonstrated an understanding of the instructions.   The patient was advised to call back or seek an in-person evaluation if the symptoms worsen or if the condition fails to improve as anticipated.  I provided 15 minutes of non-face-to-face time during this encounter.  Janene Harvey. Biagio Borg Avs printed and mailed

## 2023-06-10 ENCOUNTER — Other Ambulatory Visit: Payer: Self-pay | Admitting: Nurse Practitioner

## 2023-06-10 DIAGNOSIS — J301 Allergic rhinitis due to pollen: Secondary | ICD-10-CM
